# Patient Record
Sex: Female | Born: 1970 | Race: White | Hispanic: No | Marital: Married | Smoking: Never smoker
Health system: Southern US, Community
[De-identification: ages and names within clinical notes are randomized; demographics above are authoritative.]

## PROBLEM LIST (undated history)

## (undated) DIAGNOSIS — G43009 Migraine without aura, not intractable, without status migrainosus: Secondary | ICD-10-CM

## (undated) DIAGNOSIS — F329 Major depressive disorder, single episode, unspecified: Secondary | ICD-10-CM

## (undated) DIAGNOSIS — R87612 Low grade squamous intraepithelial lesion on cytologic smear of cervix (LGSIL): Secondary | ICD-10-CM

## (undated) DIAGNOSIS — F419 Anxiety disorder, unspecified: Secondary | ICD-10-CM

## (undated) DIAGNOSIS — Z803 Family history of malignant neoplasm of breast: Secondary | ICD-10-CM

## (undated) DIAGNOSIS — Z9189 Other specified personal risk factors, not elsewhere classified: Secondary | ICD-10-CM

## (undated) DIAGNOSIS — K219 Gastro-esophageal reflux disease without esophagitis: Secondary | ICD-10-CM

## (undated) DIAGNOSIS — E119 Type 2 diabetes mellitus without complications: Secondary | ICD-10-CM

## (undated) DIAGNOSIS — N920 Excessive and frequent menstruation with regular cycle: Secondary | ICD-10-CM

## (undated) DIAGNOSIS — Z9889 Other specified postprocedural states: Secondary | ICD-10-CM

## (undated) DIAGNOSIS — F32A Depression, unspecified: Secondary | ICD-10-CM

## (undated) DIAGNOSIS — E039 Hypothyroidism, unspecified: Secondary | ICD-10-CM

## (undated) DIAGNOSIS — Z1371 Encounter for nonprocreative screening for genetic disease carrier status: Secondary | ICD-10-CM

## (undated) DIAGNOSIS — Z8041 Family history of malignant neoplasm of ovary: Secondary | ICD-10-CM

## (undated) DIAGNOSIS — R42 Dizziness and giddiness: Secondary | ICD-10-CM

## (undated) HISTORY — PX: CHOLECYSTECTOMY: SHX55

## (undated) HISTORY — DX: Encounter for nonprocreative screening for genetic disease carrier status: Z13.71

## (undated) HISTORY — DX: Major depressive disorder, single episode, unspecified: F32.9

## (undated) HISTORY — DX: Anxiety disorder, unspecified: F41.9

## (undated) HISTORY — DX: Low grade squamous intraepithelial lesion on cytologic smear of cervix (LGSIL): R87.612

## (undated) HISTORY — DX: Excessive and frequent menstruation with regular cycle: N92.0

## (undated) HISTORY — DX: Hypothyroidism, unspecified: E03.9

## (undated) HISTORY — DX: Migraine without aura, not intractable, without status migrainosus: G43.009

## (undated) HISTORY — DX: Depression, unspecified: F32.A

## (undated) HISTORY — DX: Other specified postprocedural states: Z98.890

## (undated) HISTORY — DX: Dizziness and giddiness: R42

## (undated) HISTORY — PX: TUBAL LIGATION: SHX77

## (undated) HISTORY — DX: Other specified personal risk factors, not elsewhere classified: Z91.89

## (undated) HISTORY — DX: Family history of malignant neoplasm of breast: Z80.3

## (undated) HISTORY — DX: Family history of malignant neoplasm of ovary: Z80.41

---

## 1996-02-07 DIAGNOSIS — R87612 Low grade squamous intraepithelial lesion on cytologic smear of cervix (LGSIL): Secondary | ICD-10-CM

## 1996-02-07 HISTORY — DX: Low grade squamous intraepithelial lesion on cytologic smear of cervix (LGSIL): R87.612

## 2000-02-07 HISTORY — PX: VAGINAL HYSTERECTOMY: SUR661

## 2006-05-07 ENCOUNTER — Emergency Department: Payer: Self-pay | Admitting: Emergency Medicine

## 2007-05-10 ENCOUNTER — Emergency Department: Payer: Self-pay | Admitting: Emergency Medicine

## 2008-04-09 ENCOUNTER — Ambulatory Visit: Payer: Self-pay | Admitting: Unknown Physician Specialty

## 2008-05-04 ENCOUNTER — Ambulatory Visit: Payer: Self-pay | Admitting: General Surgery

## 2008-05-11 ENCOUNTER — Ambulatory Visit: Payer: Self-pay | Admitting: General Surgery

## 2009-11-13 ENCOUNTER — Ambulatory Visit: Payer: Self-pay | Admitting: General Practice

## 2010-04-20 ENCOUNTER — Ambulatory Visit: Payer: Self-pay | Admitting: Unknown Physician Specialty

## 2010-04-21 ENCOUNTER — Ambulatory Visit: Payer: Self-pay | Admitting: Unknown Physician Specialty

## 2011-05-09 ENCOUNTER — Ambulatory Visit: Payer: Self-pay | Admitting: General Practice

## 2012-02-07 HISTORY — PX: BREAST BIOPSY: SHX20

## 2012-05-10 ENCOUNTER — Ambulatory Visit: Payer: Self-pay | Admitting: General Practice

## 2012-05-22 ENCOUNTER — Ambulatory Visit: Payer: Self-pay | Admitting: General Practice

## 2012-12-17 DIAGNOSIS — N63 Unspecified lump in unspecified breast: Secondary | ICD-10-CM | POA: Insufficient documentation

## 2013-07-06 ENCOUNTER — Emergency Department: Payer: Self-pay | Admitting: Emergency Medicine

## 2013-07-06 LAB — CBC WITH DIFFERENTIAL/PLATELET
BASOS ABS: 0 10*3/uL (ref 0.0–0.1)
Basophil %: 0.4 %
Eosinophil #: 0.1 10*3/uL (ref 0.0–0.7)
Eosinophil %: 1.2 %
HCT: 41.4 % (ref 35.0–47.0)
HGB: 13.1 g/dL (ref 12.0–16.0)
LYMPHS ABS: 3.5 10*3/uL (ref 1.0–3.6)
LYMPHS PCT: 28.8 %
MCH: 27.1 pg (ref 26.0–34.0)
MCHC: 31.8 g/dL — ABNORMAL LOW (ref 32.0–36.0)
MCV: 85 fL (ref 80–100)
MONO ABS: 0.7 x10 3/mm (ref 0.2–0.9)
Monocyte %: 5.5 %
Neutrophil #: 7.8 10*3/uL — ABNORMAL HIGH (ref 1.4–6.5)
Neutrophil %: 64.1 %
Platelet: 319 10*3/uL (ref 150–440)
RBC: 4.85 10*6/uL (ref 3.80–5.20)
RDW: 13.5 % (ref 11.5–14.5)
WBC: 12.2 10*3/uL — ABNORMAL HIGH (ref 3.6–11.0)

## 2013-07-06 LAB — COMPREHENSIVE METABOLIC PANEL
ALBUMIN: 4.3 g/dL (ref 3.4–5.0)
Alkaline Phosphatase: 70 U/L
Anion Gap: 5 — ABNORMAL LOW (ref 7–16)
BUN: 8 mg/dL (ref 7–18)
Bilirubin,Total: 0.2 mg/dL (ref 0.2–1.0)
CALCIUM: 9.5 mg/dL (ref 8.5–10.1)
Chloride: 106 mmol/L (ref 98–107)
Co2: 26 mmol/L (ref 21–32)
Creatinine: 0.87 mg/dL (ref 0.60–1.30)
EGFR (Non-African Amer.): >60
Glucose: 88 mg/dL (ref 65–99)
OSMOLALITY: 272 (ref 275–301)
Potassium: 3.8 mmol/L (ref 3.5–5.1)
SGOT(AST): 29 U/L (ref 15–37)
SGPT (ALT): 33 U/L (ref 12–78)
Sodium: 137 mmol/L (ref 136–145)
Total Protein: 8.7 g/dL — ABNORMAL HIGH (ref 6.4–8.2)

## 2013-07-06 LAB — URINALYSIS, COMPLETE
BACTERIA: NONE SEEN
BILIRUBIN, UR: NEGATIVE
Glucose,UR: NEGATIVE mg/dL (ref 0–75)
Ketone: NEGATIVE
LEUKOCYTE ESTERASE: NEGATIVE
Nitrite: NEGATIVE
PH: 5 (ref 4.5–8.0)
Protein: NEGATIVE
RBC,UR: 4 /HPF (ref 0–5)
Specific Gravity: 1.011 (ref 1.003–1.030)
Squamous Epithelial: 2
WBC UR: <1 /HPF (ref 0–5)

## 2013-07-06 LAB — LIPASE, BLOOD: LIPASE: 122 U/L (ref 73–393)

## 2013-07-08 ENCOUNTER — Ambulatory Visit: Payer: Self-pay | Admitting: Obstetrics and Gynecology

## 2013-07-08 LAB — CA 125: CA 125: 15.2 U/mL (ref 0.0–34.0)

## 2013-07-08 LAB — WBC: WBC: 9.5 10*3/uL (ref 3.6–11.0)

## 2013-07-08 LAB — HEMATOCRIT: HCT: 37.9 % (ref 35.0–47.0)

## 2013-07-12 LAB — PATHOLOGY REPORT

## 2013-12-10 ENCOUNTER — Ambulatory Visit: Payer: Self-pay | Admitting: General Practice

## 2014-05-30 NOTE — Op Note (Signed)
PATIENT NAME:  Carly Norton, Carly Norton MR#:  [id number] DATE OF BIRTH:  [birth year]  DATE OF PROCEDURE:  07/08/2013  PREOPERATIVE DIAGNOSES:  1. Acute onset pelvic pain.  2. Left adnexal mass.   POSTOPERATIVE DIAGNOSES: Left tubal torsion and simple left ovarian cyst.   OPERATION PERFORMED: Laparoscopic bilateral salpingectomy.   ANESTHESIA USED: General.   PRIMARY SURGEON: Lorrene Reid, MD   ESTIMATED BLOOD LOSS: 25 mL   OPERATIVE FLUIDS: 1 liter of crystalloid.   URINE OUTPUT: 100 mL clear urine.   COMPLICATIONS: None.   FINDINGS: Some adhesions of the sigmoid colon to the left adnexal what initially appeared like consolidated clot in the left adnexa appeared to be tubal remnant that had undergone torsion. The sigmoid colon was mobilized off the left adnexa using a 5 mm Harmonic, and a left salpingectomy was performed. The right fallopian tube remnant appeared normal but had several small paratubal cysts and was removed as well. The remainder of the abdomen appeared grossly normal. The pedicles were hemostatic at the conclusion of the case, both ureters were visualized following the case and were noted to be coursing well away from the field of dissection.   SPECIMENS REMOVED: Right and left tubes. Left tube also had a proximal segment that was sent.   THE PATIENT CONDITION FOLLOWING PROCEDURE: Stable.   PROCEDURE IN DETAIL: Risks, benefits, and alternatives of the procedure were discussed with the patient prior to proceeding to the Operating Room. I also discussed that given the ultrasound finding we could not be conclusive as to whether or not this was arising from the ovary or fallopian tubes, although my review of the images seemed to suggest that it was fallopian tube in the origin. The patient also had a fairly strong family history of breast cancer as well as ovarian cancer in the family, and we did talk about the potential of the finding being malignant given the complexity that  was noted on the imaging, that this would likely require repeat surgery following removal if pathology supported this. The patient was in agreement and wanted to proceed with the case.   She was taken to the Operating Room where she was placed under general endotracheal anesthesia. The patient was positioned in the dorsal lithotomy position using Allen stirrups, prepped and draped in the usual sterile fashion. A timeout procedure was performed. Attention was then turned to the patient's pelvis. A Foley catheter was used to drain the patient's bladder. The patient had previously undergone a hysterectomy and a sponge stick was placed in the vagina to allow visualization of the vaginal cuff. Attention was then turned to the patient's abdomen. The umbilicus was infiltrated with 1.5% lidocaine. A stab incision was made at the base of the umbilicus and the 5 mm XL trocar was used to gain entry into the abdomen under direct visualization. Pneumoperitoneum was then begun. A second 5 mm assistant trocar was placed in the left under direct visualization along with a 10 mm right trocar. Inspection of the abdomen revealed the above findings. The sigmoid colon was mobilized off the left pelvic sidewall to allow manipulation of the left adnexal structure. Upon freeing the sigmoid colon off the sidewall using the 5 mm Harmonic, the left adnexal mass was able to be elevated and it was revealed to be left tubal remnant that had been clearly undergone torsion. The ovary appeared grossly normal other than a small 2 to 3 cm simple cyst. The 5 mm Harmonic was used to mobilize the  tube off of the sidewall and the ovary. Once this was successful, attention was turned to the patient's right adnexa which appeared grossly normal. The fallopian tube was grasped and mobilized off its attachments to the ovary mesosalpinx using the 5 mm Harmonic.   The specimens  were then placed in an Endo Catch bag and removed through the 10 mm port site.  The pelvis was irrigated and all pedicles were noted to be hemostatic. The ureters were visualized bilaterally and noted to be coursing well away from the field of dissection. Pneumoperitoneum was then evacuated. The trocar sites were removed. The 10 mm trocar site was closed using a 4-0 Monocryl. The remainder of the trocar sites were closed with Dermabond. Sponge, needle instrument counts were correct x 2. The patient tolerated the procedure well and was taken to the recovery room in stable condition.    ____________________________ Florina OuAndreas M. Bonney AidStaebler, MD ams:sg D: 07/09/2013 08:54:23 ET T: 07/09/2013 09:13:27 ET JOB#: [id number]  cc: Florina OuAndreas M. Bonney AidStaebler, MD, <Dictator> Carmel SacramentoANDREAS Cathrine MusterM Kameo Bains MD ELECTRONICALLY SIGNED 07/17/2013 8:30

## 2015-02-03 ENCOUNTER — Other Ambulatory Visit: Payer: Self-pay | Admitting: Physician Assistant

## 2015-02-03 DIAGNOSIS — G43919 Migraine, unspecified, intractable, without status migrainosus: Secondary | ICD-10-CM

## 2015-03-12 ENCOUNTER — Other Ambulatory Visit: Payer: Self-pay | Admitting: Obstetrics and Gynecology

## 2015-03-12 DIAGNOSIS — N63 Unspecified lump in unspecified breast: Secondary | ICD-10-CM

## 2015-03-24 ENCOUNTER — Ambulatory Visit
Admission: RE | Admit: 2015-03-24 | Discharge: 2015-03-24 | Disposition: A | Discharge status: Home / Self Care | Payer: BLUE CROSS/BLUE SHIELD | Source: Ambulatory Visit | Attending: Obstetrics and Gynecology | Admitting: Obstetrics and Gynecology

## 2015-03-24 ENCOUNTER — Other Ambulatory Visit: Payer: Self-pay | Admitting: Obstetrics and Gynecology

## 2015-03-24 DIAGNOSIS — N63 Unspecified lump in unspecified breast: Secondary | ICD-10-CM

## 2015-03-25 ENCOUNTER — Other Ambulatory Visit: Payer: Self-pay | Admitting: Obstetrics and Gynecology

## 2015-03-25 DIAGNOSIS — N63 Unspecified lump in unspecified breast: Secondary | ICD-10-CM

## 2015-04-01 ENCOUNTER — Ambulatory Visit
Admission: RE | Admit: 2015-04-01 | Discharge: 2015-04-01 | Disposition: A | Discharge status: Home / Self Care | Payer: BLUE CROSS/BLUE SHIELD | Source: Ambulatory Visit | Attending: Obstetrics and Gynecology | Admitting: Obstetrics and Gynecology

## 2015-04-01 DIAGNOSIS — N63 Unspecified lump in unspecified breast: Secondary | ICD-10-CM

## 2015-04-01 DIAGNOSIS — Q859 Phakomatosis, unspecified: Secondary | ICD-10-CM | POA: Diagnosis not present

## 2015-04-01 HISTORY — PX: BREAST BIOPSY: SHX20

## 2015-04-02 LAB — SURGICAL PATHOLOGY

## 2015-06-23 ENCOUNTER — Other Ambulatory Visit: Payer: Self-pay | Admitting: Physician Assistant

## 2015-10-08 ENCOUNTER — Other Ambulatory Visit: Payer: Self-pay | Admitting: Physician Assistant

## 2015-10-08 DIAGNOSIS — G43919 Migraine, unspecified, intractable, without status migrainosus: Secondary | ICD-10-CM

## 2016-04-26 ENCOUNTER — Other Ambulatory Visit: Payer: Self-pay | Admitting: Physician Assistant

## 2016-04-26 DIAGNOSIS — G43919 Migraine, unspecified, intractable, without status migrainosus: Secondary | ICD-10-CM

## 2016-06-12 DIAGNOSIS — H524 Presbyopia: Secondary | ICD-10-CM | POA: Diagnosis not present

## 2016-09-12 ENCOUNTER — Other Ambulatory Visit: Payer: Self-pay | Admitting: Family Medicine

## 2016-09-12 DIAGNOSIS — R928 Other abnormal and inconclusive findings on diagnostic imaging of breast: Secondary | ICD-10-CM

## 2016-10-30 ENCOUNTER — Ambulatory Visit
Admission: RE | Admit: 2016-10-30 | Discharge: 2016-10-30 | Disposition: A | Discharge status: Home / Self Care | Payer: BLUE CROSS/BLUE SHIELD | Source: Ambulatory Visit | Attending: Family Medicine | Admitting: Family Medicine

## 2016-10-30 DIAGNOSIS — R928 Other abnormal and inconclusive findings on diagnostic imaging of breast: Secondary | ICD-10-CM | POA: Insufficient documentation

## 2016-10-30 DIAGNOSIS — N6489 Other specified disorders of breast: Secondary | ICD-10-CM | POA: Diagnosis not present

## 2016-10-30 DIAGNOSIS — N6001 Solitary cyst of right breast: Secondary | ICD-10-CM | POA: Diagnosis not present

## 2017-09-26 ENCOUNTER — Other Ambulatory Visit: Payer: Self-pay | Admitting: Family Medicine

## 2017-09-26 DIAGNOSIS — Z1231 Encounter for screening mammogram for malignant neoplasm of breast: Secondary | ICD-10-CM

## 2017-11-02 ENCOUNTER — Ambulatory Visit
Admission: RE | Admit: 2017-11-02 | Discharge: 2017-11-02 | Disposition: A | Discharge status: Home / Self Care | Payer: BLUE CROSS/BLUE SHIELD | Source: Ambulatory Visit | Attending: Family Medicine | Admitting: Family Medicine

## 2017-11-02 DIAGNOSIS — Z1231 Encounter for screening mammogram for malignant neoplasm of breast: Secondary | ICD-10-CM | POA: Diagnosis not present

## 2017-12-27 ENCOUNTER — Ambulatory Visit: Payer: Self-pay | Admitting: Nurse Practitioner

## 2018-05-08 DIAGNOSIS — Z1371 Encounter for nonprocreative screening for genetic disease carrier status: Secondary | ICD-10-CM

## 2018-05-08 DIAGNOSIS — Z9189 Other specified personal risk factors, not elsewhere classified: Secondary | ICD-10-CM

## 2018-05-08 HISTORY — DX: Other specified personal risk factors, not elsewhere classified: Z91.89

## 2018-05-08 HISTORY — DX: Encounter for nonprocreative screening for genetic disease carrier status: Z13.71

## 2018-05-15 ENCOUNTER — Encounter: Payer: Self-pay | Admitting: Obstetrics and Gynecology

## 2018-05-15 ENCOUNTER — Other Ambulatory Visit: Payer: Self-pay

## 2018-05-15 ENCOUNTER — Ambulatory Visit (INDEPENDENT_AMBULATORY_CARE_PROVIDER_SITE_OTHER): Payer: 59 | Admitting: Obstetrics and Gynecology

## 2018-05-15 VITALS — BP 140/80 | Ht 65.0 in | Wt 232.0 lb

## 2018-05-15 DIAGNOSIS — Z803 Family history of malignant neoplasm of breast: Secondary | ICD-10-CM

## 2018-05-15 DIAGNOSIS — Z801 Family history of malignant neoplasm of trachea, bronchus and lung: Secondary | ICD-10-CM | POA: Diagnosis not present

## 2018-05-15 DIAGNOSIS — Z853 Personal history of malignant neoplasm of breast: Secondary | ICD-10-CM | POA: Diagnosis not present

## 2018-05-15 DIAGNOSIS — N6310 Unspecified lump in the right breast, unspecified quadrant: Secondary | ICD-10-CM

## 2018-05-15 DIAGNOSIS — C50912 Malignant neoplasm of unspecified site of left female breast: Secondary | ICD-10-CM | POA: Diagnosis not present

## 2018-05-15 DIAGNOSIS — Z8041 Family history of malignant neoplasm of ovary: Secondary | ICD-10-CM

## 2018-05-15 DIAGNOSIS — C50911 Malignant neoplasm of unspecified site of right female breast: Secondary | ICD-10-CM | POA: Diagnosis not present

## 2018-05-15 DIAGNOSIS — N6311 Unspecified lump in the right breast, upper outer quadrant: Secondary | ICD-10-CM

## 2018-05-15 DIAGNOSIS — N6320 Unspecified lump in the left breast, unspecified quadrant: Secondary | ICD-10-CM

## 2018-05-15 DIAGNOSIS — N6325 Unspecified lump in the left breast, overlapping quadrants: Secondary | ICD-10-CM

## 2018-05-15 NOTE — Patient Instructions (Signed)
I value your feedback and entrusting us with your care. If you get a [facility] patient survey, I would appreciate you taking the time to let us know about your experience today. Thank you! 

## 2018-05-15 NOTE — Progress Notes (Addendum)
Patient, No Pcp Per   Chief Complaint  Patient presents with  . BREAST EXAM    lump on left breast, shooting pain for the last couple of weeks, lump on right breast, not sure if it has gotten bigger since last felt    HPI:      [name] is a 48 y.o. No obstetric history on file. who LMP was No LMP recorded. Patient has had a hysterectomy., presents today for bilat breast masses. Pt noticed a larger LT breast mass outer quadrant for several wks. Area is not tender to touch but does shoot pains to her breast. No recent trauma, erythema, nipple d/c. 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 Pt also with new RT breast mass near axilla. Area is more prominent and has outer quadrant breast tenderness. Again, no erythema, trauma, nipple d/c. Hx of complicated cysts RT breast with resolution in 5-6:00 position in 2018.  Pt with FH breast and ovar cancer, genetic testing never done.  Pt is s/p vag hyst with Dr. Rayford Halsted in 2002 for menorrhagia/anemia. No longer doing paps. No recent annual.  Past Medical History:  Diagnosis Date  . Anxiety and depression   . Common migraine   . Family history of breast cancer   . Family history of ovarian cancer   . Hypothyroidism   . LGSIL on Pap smear of cervix 1998  . Menorrhagia   . Vertigo     Past Surgical History:  Procedure Laterality Date  . BREAST BIOPSY Left 2014   benign  . BREAST BIOPSY Left 04/01/2015   path pending  . TUBAL LIGATION    . VAGINAL HYSTERECTOMY  2002   Dr. Rayford Halsted; due to menorrhagia/anemia    Family History  Problem Relation Age of Onset  . Breast cancer Paternal Aunt 43  . Hypothyroidism Mother   . Cancer Father   . Heart failure Father   . Depression Sister   . Ovarian cancer Maternal Grandmother 37  . Pancreatic cancer Maternal Grandfather 75  . Breast cancer Paternal Aunt 62  . Colon cancer Maternal Aunt 39    Social History   Socioeconomic  History  . Marital status: Married    Spouse name: Not on file  . Number of children: Not on file  . Years of education: Not on file  . Highest education level: Not on file  Occupational History  . Not on file  Social Needs  . Financial resource strain: Not on file  . Food insecurity:    Worry: Not on file    Inability: Not on file  . Transportation needs:    Medical: Not on file    Non-medical: Not on file  Tobacco Use  . Smoking status: Never Smoker  . Smokeless tobacco: Never Used  Substance and Sexual Activity  . Alcohol use: Not Currently    Frequency: Never  . Drug use: Not Currently  . Sexual activity: Yes    Birth control/protection: Surgical    Comment: Hysterectomy  Lifestyle  . Physical activity:    Days per week: Not on file    Minutes per session: Not on file  . Stress: Not on file  Relationships  . Social connections:    Talks on phone: Not on file    Gets together: Not on file    Attends religious service: Not on file    Active member of club or organization: Not  on file    Attends meetings of clubs or organizations: Not on file    Relationship status: Not on file  . Intimate partner violence:    Fear of current or ex partner: Not on file    Emotionally abused: Not on file    Physically abused: Not on file    Forced sexual activity: Not on file  Other Topics Concern  . Not on file  Social History Narrative  . Not on file    Outpatient Medications Prior to Visit  Medication Sig Dispense Refill  . busPIRone (BUSPAR) 10 MG tablet     . eletriptan (RELPAX) 20 MG tablet Take by mouth.    . EPINEPHrine 0.3 mg/0.3 mL IJ SOAJ injection Inject into the muscle.    Marland Kitchen FLUoxetine (PROZAC) 40 MG capsule     . levothyroxine (SYNTHROID, LEVOTHROID) 75 MCG tablet Take by mouth.    . meclizine (ANTIVERT) 25 MG tablet Take by mouth.    . topiramate (TOPAMAX) 25 MG tablet TAKE UP TO 4 TABLETS A DAY 120 tablet 3   No facility-administered medications prior to  visit.       ROS:  Review of Systems  Constitutional: Positive for fatigue. Negative for fever and unexpected weight change.  Respiratory: Negative for cough, shortness of breath and wheezing.   Cardiovascular: Negative for chest pain, palpitations and leg swelling.  Gastrointestinal: Negative for blood in stool, constipation, diarrhea, nausea and vomiting.  Endocrine: Negative for cold intolerance, heat intolerance and polyuria.  Genitourinary: Positive for frequency. Negative for dyspareunia, dysuria, flank pain, genital sores, hematuria, menstrual problem, pelvic pain, urgency, vaginal bleeding, vaginal discharge and vaginal pain.  Musculoskeletal: Negative for back pain, joint swelling and myalgias.  Skin: Negative for rash.  Neurological: Positive for dizziness and headaches. Negative for syncope, light-headedness and numbness.  Hematological: Negative for adenopathy.  Psychiatric/Behavioral: Positive for agitation and dysphoric mood. Negative for confusion, sleep disturbance and suicidal ideas. The patient is not nervous/anxious.    BREAST: lumps/tenderness   OBJECTIVE:   Vitals:  BP 140/80   Ht _0  (1.651 m)   Wt 232 lb (105.2 kg)   BMI 38.61 kg/m   Physical Exam Vitals signs reviewed.  Neck:     Musculoskeletal: Normal range of motion.  Pulmonary:     Effort: Pulmonary effort is normal.  Chest:     Breasts: Breasts are symmetrical.        Right: No inverted nipple, mass, nipple discharge, skin change or tenderness.        Left: No inverted nipple, mass, nipple discharge, skin change or tenderness.    Musculoskeletal: Normal range of motion.  Skin:    General: Skin is warm and dry.  Neurological:     General: No focal deficit present.     Mental Status: She is alert and oriented to person, place, and time.     Cranial Nerves: No cranial nerve deficit.  Psychiatric:        Mood and Affect: Mood normal.        Behavior: Behavior normal.        Thought  Content: Thought content normal.        Judgment: Judgment normal.     Assessment/Plan: Breast mass, left - LOQ; check dx mammo and u/s. Hx of hemartoma in same area, but area is larger now. Will f/u with results.  - Plan: US BREAST LTD UNI LEFT INC AXILLA, MM DIAG BREAST TOMO BILATERAL, US BREAST LTD UNI RIGHT  INC AXILLA  Breast mass, right - RT axilla. Check dx mammo and u/s. If neg, most likely prominent tissue. Will f/u with results.  - Plan: US BREAST LTD UNI LEFT INC AXILLA, MM DIAG BREAST TOMO BILATERAL, US BREAST LTD UNI RIGHT INC AXILLA  Family history of breast cancer - MyRisk testing discussed and done today. Will call wiht resutls.  - Plan: [facility])  Family history of ovarian cancer - Plan: [facility] ([place])    Return in about 2 months (around 08/10/9161) for annual.  Dock Baccam B. Taneesha Edgin, PA-C 05/15/2018 11:06 AM

## 2018-05-20 ENCOUNTER — Ambulatory Visit
Admission: RE | Admit: 2018-05-20 | Discharge: 2018-05-20 | Disposition: A | Discharge status: Home / Self Care | Payer: BLUE CROSS/BLUE SHIELD | Source: Ambulatory Visit | Attending: Obstetrics and Gynecology | Admitting: Obstetrics and Gynecology

## 2018-05-20 ENCOUNTER — Other Ambulatory Visit: Payer: Self-pay

## 2018-05-20 DIAGNOSIS — N6310 Unspecified lump in the right breast, unspecified quadrant: Secondary | ICD-10-CM

## 2018-05-20 DIAGNOSIS — N6320 Unspecified lump in the left breast, unspecified quadrant: Secondary | ICD-10-CM | POA: Diagnosis not present

## 2018-05-30 ENCOUNTER — Encounter: Payer: Self-pay | Admitting: Obstetrics and Gynecology

## 2018-06-14 ENCOUNTER — Encounter: Payer: Self-pay | Admitting: Obstetrics and Gynecology

## 2018-06-25 ENCOUNTER — Telehealth: Payer: Self-pay | Admitting: Obstetrics and Gynecology

## 2018-06-25 NOTE — Telephone Encounter (Signed)
Pt aware of neg MyRisk results except ATM and MSH2 VUS. IBIS=22.5%/riskscore=16.2%. Pt's last mammo 4/20. Discussed monthly SBE, yearly CBE (pt annual due 6/20), yearly mammo and scr breast MRI. Pt to call for breast MRI by 9/20 if interested.   Patient understands these results only apply to her and her children, and this is not indicative of genetic testing results of her other family members. It is recommended that her other family members have genetic testing done.  Pt also understands negative genetic testing doesn't mean she will never get any of these cancers.   Hard copy mailed to pt. F/u prn.

## 2018-07-05 ENCOUNTER — Other Ambulatory Visit: Payer: Self-pay

## 2018-07-10 LAB — CMP12+LP+TP+TSH+6AC+CBC/D/PLT

## 2018-07-10 LAB — FSH/LH

## 2018-07-15 ENCOUNTER — Other Ambulatory Visit: Payer: Self-pay

## 2018-07-16 LAB — CMP12+LP+TP+TSH+6AC+CBC/D/PLT
ALT: 22 IU/L (ref 0–32)
AST: 19 IU/L (ref 0–40)
Albumin/Globulin Ratio: 1.4 (ref 1.2–2.2)
Albumin: 4 g/dL (ref 3.8–4.8)
Alkaline Phosphatase: 69 IU/L (ref 39–117)
BUN/Creatinine Ratio: 13 (ref 9–23)
BUN: 10 mg/dL (ref 6–24)
Basophils Absolute: 0.1 10*3/uL (ref 0.0–0.2)
Basos: 1 %
Bilirubin Total: <0.2 mg/dL (ref 0.0–1.2)
Calcium: 8.8 mg/dL (ref 8.7–10.2)
Chloride: 103 mmol/L (ref 96–106)
Chol/HDL Ratio: 4.4 ratio (ref 0.0–4.4)
Cholesterol, Total: 188 mg/dL (ref 100–199)
Creatinine, Ser: 0.76 mg/dL (ref 0.57–1.00)
EOS (ABSOLUTE): 0.2 10*3/uL (ref 0.0–0.4)
Eos: 2 %
Estimated CHD Risk: 1 times avg. (ref 0.0–1.0)
Free Thyroxine Index: 0.8 — ABNORMAL LOW (ref 1.2–4.9)
GFR calc Af Amer: 107 mL/min/{1.73_m2} (ref >59)
GFR calc non Af Amer: 93 mL/min/{1.73_m2} (ref >59)
GGT: 19 IU/L (ref 0–60)
Globulin, Total: 2.9 g/dL (ref 1.5–4.5)
Glucose: 89 mg/dL (ref 65–99)
HDL: 43 mg/dL (ref >39)
Hematocrit: 35.6 % (ref 34.0–46.6)
Hemoglobin: 11.7 g/dL (ref 11.1–15.9)
Immature Grans (Abs): 0 10*3/uL (ref 0.0–0.1)
Immature Granulocytes: 0 %
Iron: 42 ug/dL (ref 27–159)
LDH: 142 IU/L (ref 119–226)
LDL Calculated: 97 mg/dL (ref 0–99)
Lymphocytes Absolute: 2.5 10*3/uL (ref 0.7–3.1)
Lymphs: 30 %
MCH: 26.5 pg — ABNORMAL LOW (ref 26.6–33.0)
MCHC: 32.9 g/dL (ref 31.5–35.7)
MCV: 81 fL (ref 79–97)
Monocytes Absolute: 0.6 10*3/uL (ref 0.1–0.9)
Monocytes: 7 %
Neutrophils Absolute: 5 10*3/uL (ref 1.4–7.0)
Neutrophils: 60 %
Phosphorus: 4 mg/dL (ref 3.0–4.3)
Platelets: 304 10*3/uL (ref 150–450)
Potassium: 4 mmol/L (ref 3.5–5.2)
RBC: 4.42 x10E6/uL (ref 3.77–5.28)
RDW: 13.2 % (ref 11.7–15.4)
Sodium: 137 mmol/L (ref 134–144)
T3 Uptake Ratio: 13 % — ABNORMAL LOW (ref 24–39)
T4, Total: 5.9 ug/dL (ref 4.5–12.0)
TSH: 18.9 u[IU]/mL — ABNORMAL HIGH (ref 0.450–4.500)
Total Protein: 6.9 g/dL (ref 6.0–8.5)
Triglycerides: 238 mg/dL — ABNORMAL HIGH (ref 0–149)
Uric Acid: 6.9 mg/dL (ref 2.5–7.1)
VLDL Cholesterol Cal: 48 mg/dL — ABNORMAL HIGH (ref 5–40)
WBC: 8.3 10*3/uL (ref 3.4–10.8)

## 2018-07-16 LAB — FSH/LH
FSH: 1.5 m[IU]/mL
LH: 4.9 m[IU]/mL

## 2018-07-22 DIAGNOSIS — H6980 Other specified disorders of Eustachian tube, unspecified ear: Secondary | ICD-10-CM | POA: Diagnosis not present

## 2018-07-22 DIAGNOSIS — H8110 Benign paroxysmal vertigo, unspecified ear: Secondary | ICD-10-CM | POA: Diagnosis not present

## 2018-07-22 DIAGNOSIS — H93299 Other abnormal auditory perceptions, unspecified ear: Secondary | ICD-10-CM | POA: Diagnosis not present

## 2018-07-22 DIAGNOSIS — H8112 Benign paroxysmal vertigo, left ear: Secondary | ICD-10-CM | POA: Diagnosis not present

## 2018-07-22 DIAGNOSIS — H903 Sensorineural hearing loss, bilateral: Secondary | ICD-10-CM | POA: Diagnosis not present

## 2018-08-13 ENCOUNTER — Other Ambulatory Visit: Payer: Self-pay | Admitting: Registered Nurse

## 2018-08-14 NOTE — Telephone Encounter (Signed)
Forwarding from [facility]

## 2018-08-14 NOTE — Telephone Encounter (Signed)
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

## 2018-09-16 ENCOUNTER — Other Ambulatory Visit: Payer: Self-pay

## 2018-11-05 ENCOUNTER — Other Ambulatory Visit: Payer: Self-pay

## 2018-11-05 DIAGNOSIS — K219 Gastro-esophageal reflux disease without esophagitis: Secondary | ICD-10-CM

## 2018-11-05 MED ORDER — PANTOPRAZOLE SODIUM 40 MG PO TBEC: 40.0000 mg | DELAYED_RELEASE_TABLET | Freq: Every day | ORAL | 0 refills | Status: AC

## 2018-12-04 ENCOUNTER — Other Ambulatory Visit: Payer: Self-pay

## 2018-12-04 DIAGNOSIS — T7840XD Allergy, unspecified, subsequent encounter: Secondary | ICD-10-CM

## 2018-12-04 DIAGNOSIS — H8110 Benign paroxysmal vertigo, unspecified ear: Secondary | ICD-10-CM

## 2018-12-04 MED ORDER — MECLIZINE HCL 25 MG PO TABS: 25.0000 mg | ORAL_TABLET | Freq: Two times a day (BID) | ORAL | 0 refills | Status: DC | PRN

## 2018-12-04 MED ORDER — EPINEPHRINE 0.3 MG/0.3ML IJ SOAJ: 0.3000 mg | INTRAMUSCULAR | 0 refills | Status: AC | PRN

## 2018-12-04 NOTE — Telephone Encounter (Signed)
Last appt with Dr Roxan Hockey 07/17/2018 and instructed to schedule follow up with ENT as meclizine not working as well for her.  No visits noted in Epic;   paper chart at [facility] noted visit July 22 2018 BPPV treated with Epley maneuvers.  And she was to follow up prn.  Refilled meclizine and epi pen electronically to her pharmacy of choice today.  Meclizine 25mg  po BID prn #30 RF0 and epipen im prn use 0.3mg  #1 RF0  Patient to follow up with ENT if no improvement in her symptoms with meclizine.  Ensure she is taking her seasonal allergy medications and showering before bed and not sleeping with windows open also.  Avoid driving during vertigo and caution meclizine can cause drowsiness.

## 2019-01-15 HISTORY — PX: BREAST BIOPSY: SHX20

## 2019-01-26 ENCOUNTER — Other Ambulatory Visit: Payer: Self-pay | Admitting: Registered Nurse

## 2019-01-26 ENCOUNTER — Encounter: Payer: Self-pay | Admitting: Registered Nurse

## 2019-01-26 DIAGNOSIS — G43909 Migraine, unspecified, not intractable, without status migrainosus: Secondary | ICD-10-CM | POA: Insufficient documentation

## 2019-01-26 DIAGNOSIS — R42 Dizziness and giddiness: Secondary | ICD-10-CM | POA: Insufficient documentation

## 2019-01-26 DIAGNOSIS — E039 Hypothyroidism, unspecified: Secondary | ICD-10-CM | POA: Insufficient documentation

## 2019-01-26 DIAGNOSIS — T7840XD Allergy, unspecified, subsequent encounter: Secondary | ICD-10-CM

## 2019-01-26 DIAGNOSIS — H8110 Benign paroxysmal vertigo, unspecified ear: Secondary | ICD-10-CM

## 2019-01-26 NOTE — Telephone Encounter (Signed)
[facility] refilled patient on 12/04/2018.  Please contact patient to verify if meclizine working and how many epi pens she has used since 12/04/2018 and what the circumstances were.  No ENT visits seen in Epic or clinic visits since last visit with Dr Roxan Hockey (see 12/04/2018 tcon).

## 2019-01-27 ENCOUNTER — Telehealth: Payer: Self-pay

## 2019-01-27 NOTE — Telephone Encounter (Signed)
Tried to call Marishka for information that Otila Kluver is requesting.  Went straight to voice mail & mailbox full & unable to leave a message.  AMD

## 2019-01-27 NOTE — Telephone Encounter (Signed)
Didn't hear from [place].  Sent message to her about questions that need to be answered.  AMD

## 2019-01-29 ENCOUNTER — Other Ambulatory Visit: Payer: Self-pay

## 2019-01-29 DIAGNOSIS — Z8669 Personal history of other diseases of the nervous system and sense organs: Secondary | ICD-10-CM

## 2019-01-29 MED ORDER — ELETRIPTAN HYDROBROMIDE 40 MG PO TABS: ORAL_TABLET | ORAL | 2 refills | Status: AC

## 2019-01-29 NOTE — Telephone Encounter (Signed)
Finally made contact with Carly Norton.  States she didn't request Epi Pen refill.  Said she has one.  Doesn't know if she hit by mistake.  Meclizine - states she has to take every day or she doesn't feel right.  Appt scheduled for next week - fatigue. 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    AMD

## 2019-01-30 ENCOUNTER — Encounter: Payer: Self-pay | Admitting: Registered Nurse

## 2019-01-30 NOTE — Telephone Encounter (Signed)
Noted appt pending.  Meclizine refilled. No epi pen refill needed at this time.  Electronic Rx sent to her pharmacy of choice.

## 2019-02-02 ENCOUNTER — Emergency Department: Payer: 59

## 2019-02-02 ENCOUNTER — Other Ambulatory Visit: Payer: Self-pay

## 2019-02-02 ENCOUNTER — Inpatient Hospital Stay
Admission: EM | Admit: 2019-02-02 | Discharge: 2019-02-05 | DRG: 287 | Disposition: A | Discharge status: Home / Self Care | Payer: 59 | Attending: Internal Medicine | Admitting: Internal Medicine

## 2019-02-02 DIAGNOSIS — E039 Hypothyroidism, unspecified: Secondary | ICD-10-CM | POA: Diagnosis present

## 2019-02-02 DIAGNOSIS — E876 Hypokalemia: Secondary | ICD-10-CM | POA: Diagnosis present

## 2019-02-02 DIAGNOSIS — E785 Hyperlipidemia, unspecified: Secondary | ICD-10-CM | POA: Diagnosis present

## 2019-02-02 DIAGNOSIS — Z818 Family history of other mental and behavioral disorders: Secondary | ICD-10-CM

## 2019-02-02 DIAGNOSIS — I2489 Other forms of acute ischemic heart disease: Secondary | ICD-10-CM

## 2019-02-02 DIAGNOSIS — I10 Essential (primary) hypertension: Secondary | ICD-10-CM | POA: Diagnosis not present

## 2019-02-02 DIAGNOSIS — Z90710 Acquired absence of both cervix and uterus: Secondary | ICD-10-CM

## 2019-02-02 DIAGNOSIS — I160 Hypertensive urgency: Secondary | ICD-10-CM

## 2019-02-02 DIAGNOSIS — Z91040 Latex allergy status: Secondary | ICD-10-CM | POA: Diagnosis not present

## 2019-02-02 DIAGNOSIS — Z803 Family history of malignant neoplasm of breast: Secondary | ICD-10-CM

## 2019-02-02 DIAGNOSIS — R739 Hyperglycemia, unspecified: Secondary | ICD-10-CM | POA: Diagnosis present

## 2019-02-02 DIAGNOSIS — Z6836 Body mass index (BMI) 36.0-36.9, adult: Secondary | ICD-10-CM | POA: Diagnosis not present

## 2019-02-02 DIAGNOSIS — Z20828 Contact with and (suspected) exposure to other viral communicable diseases: Secondary | ICD-10-CM | POA: Diagnosis not present

## 2019-02-02 DIAGNOSIS — Z8249 Family history of ischemic heart disease and other diseases of the circulatory system: Secondary | ICD-10-CM | POA: Diagnosis not present

## 2019-02-02 DIAGNOSIS — F329 Major depressive disorder, single episode, unspecified: Secondary | ICD-10-CM | POA: Diagnosis present

## 2019-02-02 DIAGNOSIS — Z91030 Bee allergy status: Secondary | ICD-10-CM | POA: Diagnosis not present

## 2019-02-02 DIAGNOSIS — Z8041 Family history of malignant neoplasm of ovary: Secondary | ICD-10-CM

## 2019-02-02 DIAGNOSIS — R42 Dizziness and giddiness: Secondary | ICD-10-CM | POA: Diagnosis present

## 2019-02-02 DIAGNOSIS — Z79890 Hormone replacement therapy (postmenopausal): Secondary | ICD-10-CM

## 2019-02-02 DIAGNOSIS — R0602 Shortness of breath: Secondary | ICD-10-CM | POA: Diagnosis not present

## 2019-02-02 DIAGNOSIS — Z79899 Other long term (current) drug therapy: Secondary | ICD-10-CM | POA: Diagnosis not present

## 2019-02-02 DIAGNOSIS — I214 Non-ST elevation (NSTEMI) myocardial infarction: Secondary | ICD-10-CM

## 2019-02-02 DIAGNOSIS — I259 Chronic ischemic heart disease, unspecified: Secondary | ICD-10-CM

## 2019-02-02 DIAGNOSIS — I248 Other forms of acute ischemic heart disease: Principal | ICD-10-CM | POA: Diagnosis present

## 2019-02-02 DIAGNOSIS — R69 Illness, unspecified: Secondary | ICD-10-CM | POA: Diagnosis not present

## 2019-02-02 DIAGNOSIS — F419 Anxiety disorder, unspecified: Secondary | ICD-10-CM

## 2019-02-02 DIAGNOSIS — Z888 Allergy status to other drugs, medicaments and biological substances status: Secondary | ICD-10-CM

## 2019-02-02 DIAGNOSIS — E669 Obesity, unspecified: Secondary | ICD-10-CM | POA: Diagnosis not present

## 2019-02-02 LAB — CBC
HCT: 35.8 % — ABNORMAL LOW (ref 36.0–46.0)
Hemoglobin: 11.5 g/dL — ABNORMAL LOW (ref 12.0–15.0)
MCH: 26.7 pg (ref 26.0–34.0)
MCHC: 32.1 g/dL (ref 30.0–36.0)
MCV: 83.1 fL (ref 80.0–100.0)
Platelets: 290 10*3/uL (ref 150–400)
RBC: 4.31 MIL/uL (ref 3.87–5.11)
RDW: 13.9 % (ref 11.5–15.5)
WBC: 9.7 10*3/uL (ref 4.0–10.5)
nRBC: 0 % (ref 0.0–0.2)

## 2019-02-02 LAB — BASIC METABOLIC PANEL
Anion gap: 9 (ref 5–15)
BUN: 11 mg/dL (ref 6–20)
CO2: 25 mmol/L (ref 22–32)
Calcium: 8.9 mg/dL (ref 8.9–10.3)
Chloride: 106 mmol/L (ref 98–111)
Creatinine, Ser: 0.82 mg/dL (ref 0.44–1.00)
GFR calc Af Amer: >60 mL/min (ref >60)
GFR calc non Af Amer: >60 mL/min (ref >60)
Glucose, Bld: 128 mg/dL — ABNORMAL HIGH (ref 70–99)
Potassium: 3.6 mmol/L (ref 3.5–5.1)
Sodium: 140 mmol/L (ref 135–145)

## 2019-02-02 LAB — TROPONIN I (HIGH SENSITIVITY): Troponin I (High Sensitivity): 185 ng/L (ref <18)

## 2019-02-02 MED ORDER — HEPARIN SODIUM (PORCINE) 5000 UNIT/ML IJ SOLN
4000.0000 [IU] | Freq: Once | INTRAMUSCULAR | Status: DC
  Administered 2019-02-03: 4000 [IU] via INTRAVENOUS
  Filled 2019-02-02: qty 1

## 2019-02-02 MED ORDER — NITROGLYCERIN 2 % TD OINT
1.0000 [in_us] | TOPICAL_OINTMENT | Freq: Once | TRANSDERMAL | Status: AC
  Administered 2019-02-03: 1 [in_us] via TOPICAL
  Filled 2019-02-02: qty 1

## 2019-02-02 MED ORDER — HEPARIN (PORCINE) 25000 UT/250ML-% IV SOLN
1400.0000 [IU]/h | INTRAVENOUS | Status: DC
  Filled 2019-02-02: qty 250

## 2019-02-02 MED ORDER — ASPIRIN 81 MG PO CHEW
324.0000 mg | CHEWABLE_TABLET | Freq: Once | ORAL | Status: AC
  Administered 2019-02-03: 324 mg via ORAL
  Filled 2019-02-02: qty 4

## 2019-02-02 NOTE — ED Notes (Signed)
Carney Harder, rn notified of pt's troponin level, heather charge rn is rooming pt at this time.

## 2019-02-02 NOTE — ED Triage Notes (Signed)
Pt states has felt shob for a day. Pt states she has had chest discomfort off and on for days. Pt states today at work a Art therapist noted her face was flushed and checked her blood pressure. Pt states reading was 208/132. Pt does appears shob with exertion.

## 2019-02-02 NOTE — ED Notes (Signed)
Dr. Beather Arbour notified of elevated critical troponin of 185, no new orders received.

## 2019-02-02 NOTE — ED Provider Notes (Signed)
[facility] Emergency Department Provider Note   ____________________________________________   First MD Initiated Contact with Patient 02/02/19 2333     (approximate)  I have reviewed the triage vital signs and the nursing notes.   HISTORY  Chief Complaint Hypertension    HPI Carly Norton is a 48 y.o. female who presents to the ED from home with a chief complaint of chest pain and shortness of breath.  Patient with a history of migraines, hypothyroidism who experienced chest discomfort earlier this evening.  Coworkers noted her face was flushed and checked her blood pressure which was 208/132.  Patient does not take antihypertensives.  Complains of left-sided chest pressure, nonradiating, not associated diaphoresis, nausea/vomiting, palpitations or dizziness.  Does complain of dyspnea on exertion.  Denies recent fever, cough, abdominal pain, diarrhea.  Denies recent travel or trauma.  Denies OCP use.      Past Medical History:  Diagnosis Date  . Anxiety and depression   . BRCA negative 05/2018   MyRisk neg except ATM and MSH2 VUS  . Common migraine   . Family history of breast cancer   . Family history of ovarian cancer   . H/O benign breast biopsy   . Hypothyroidism   . Increased risk of breast cancer 05/2018   IBIS=22.5%/riskscore=16.2%  . LGSIL on Pap smear of cervix 1998  . Menorrhagia   . Vertigo     Patient Active Problem List   Diagnosis Date Noted  . NSTEMI (non-ST elevated myocardial infarction) ([place]) 02/03/2019  . Hypertensive urgency 02/03/2019  . Anxiety 02/03/2019  . Hypothyroidism 01/26/2019  . Migraine 01/26/2019  . Vertigo 01/26/2019  . Breast mass 12/17/2012    Past Surgical History:  Procedure Laterality Date  . BREAST BIOPSY Left 2014   benign  . BREAST BIOPSY Left 04/01/2015   path pending  . TUBAL LIGATION    . VAGINAL HYSTERECTOMY  2002   Dr. Rayford Halsted; due to menorrhagia/anemia    Prior to Admission  medications   Medication Sig Start Date End Date Taking? Authorizing Provider  busPIRone (BUSPAR) 10 MG tablet  02/13/18   [provider]  eletriptan (RELPAX) 40 MG tablet Take 1 tablet po as a single dose.  May repeat dose once in two hours 01/29/19   Sable Feil, PA-C  EPINEPHrine 0.3 mg/0.3 mL IJ SOAJ injection Inject 0.3 mLs (0.3 mg total) into the muscle as needed for anaphylaxis. 12/04/18   Betancourt, Aura Fey, NP  FLUoxetine (PROZAC) 40 MG capsule  02/14/18   [provider]  levothyroxine (SYNTHROID) 125 MCG tablet Take 125 mcg by mouth daily. 10/12/18   [provider]  meclizine (ANTIVERT) 25 MG tablet TAKE 1 TABLET (25 MG TOTAL) BY MOUTH 2 (TWO) TIMES DAILY AS NEEDED. 01/30/19   Betancourt, Aura Fey, NP  pantoprazole (PROTONIX) 40 MG tablet Take 1 tablet (40 mg total) by mouth daily. 11/05/18   Jan Fireman, PA-C  topiramate (TOPAMAX) 25 MG tablet TAKE UP TO 4 TABLETS A DAY 10/08/15   Mar Daring, PA-C    Allergies Bee venom, Fluconazole, Latex, and Lidocaine  Family History  Problem Relation Age of Onset  . Breast cancer Paternal Aunt 44  . Hypothyroidism Mother   . Cancer Father   . Heart failure Father   . Depression Sister   . Ovarian cancer Maternal Grandmother 58  . Pancreatic cancer Maternal Grandfather 49  . Breast cancer Paternal Aunt 37  . Colon cancer Maternal Aunt  50    Social History Social History   Tobacco Use  . Smoking status: Never Smoker  . Smokeless tobacco: Never Used  Substance Use Topics  . Alcohol use: Not Currently  . Drug use: Not Currently    Review of Systems  Constitutional: No fever/chills Eyes: No visual changes. ENT: No sore throat. Cardiovascular: Positive for chest pain. Respiratory: Positive for shortness of breath. Gastrointestinal: No abdominal pain.  No nausea, no vomiting.  No diarrhea.  No constipation. Genitourinary: Negative for dysuria. Musculoskeletal: Negative for back pain. Skin:  Negative for rash. Neurological: Negative for headaches, focal weakness or numbness.   ____________________________________________   PHYSICAL EXAM:  VITAL SIGNS: ED Triage Vitals  Enc Vitals Group     BP 02/02/19 2238 (!) 173/92     Pulse Rate 02/02/19 2238 86     Resp 02/02/19 2238 (!) 22     Temp --      Temp Source 02/02/19 2238 Oral     SpO2 --      Weight 02/02/19 2239 220 lb (99.8 kg)     Height 02/02/19 2239 5' 5"  (1.651 m)     Head Circumference --      Peak Flow --      Pain Score 02/02/19 2238 0     Pain Loc --      Pain Edu? --      Excl. in [employer]? --     Constitutional: Alert and oriented.  Anxious appearing and in mild acute distress. Eyes: Conjunctivae are normal. PERRL. EOMI. Head: Atraumatic. Nose: No congestion/rhinnorhea. Mouth/Throat: Mucous membranes are moist.  Oropharynx non-erythematous. Neck: No stridor.   Cardiovascular: Normal rate, regular rhythm. Grossly normal heart sounds.  Good peripheral circulation. Respiratory: Normal respiratory effort.  No retractions. Lungs CTAB. Gastrointestinal: Soft and nontender. No distention. No abdominal bruits. No CVA tenderness. Musculoskeletal: No lower extremity tenderness nor edema.  No joint effusions. Neurologic:  Normal speech and language. No gross focal neurologic deficits are appreciated. No gait instability. Skin:  Skin is warm, dry and intact. No rash noted. Psychiatric: Mood and affect are normal. Speech and behavior are normal.  ____________________________________________   LABS (all labs ordered are listed, but only abnormal results are displayed)  Labs Reviewed  BASIC METABOLIC PANEL - Abnormal; Notable for the following components:      Result Value   Glucose, Bld 128 (*)    All other components within normal limits  CBC - Abnormal; Notable for the following components:   Hemoglobin 11.5 (*)    HCT 35.8 (*)    All other components within normal limits  TROPONIN I (HIGH SENSITIVITY) -  Abnormal; Notable for the following components:   Troponin I (High Sensitivity) 185 (*)    All other components within normal limits  SARS CORONAVIRUS 2 (TAT 6-24 HRS)  HEPARIN LEVEL (UNFRACTIONATED)  APTT  PROTIME-INR  HIV ANTIBODY (ROUTINE TESTING W REFLEX)  LIPID PANEL  TROPONIN I (HIGH SENSITIVITY)   ____________________________________________  EKG  ED ECG REPORT I, Sedonia Kitner J, the attending physician, personally viewed and interpreted this ECG.   Date: 02/02/2019  EKG Time: 2242  Rate: 75  Rhythm: normal EKG, normal sinus rhythm  Axis: Normal  Intervals:none  ST&T Change: Nonspecific  ____________________________________________  RADIOLOGY  ED MD interpretation: No acute cardiopulmonary process  Official radiology report(s): DG Chest 2 View  Result Date: 02/02/2019 CLINICAL DATA:  Hypertension, shortness of breath for 1 day EXAM: CHEST - 2 VIEW COMPARISON:  Radiograph 05/07/2006 FINDINGS:  No consolidation, features of edema, pneumothorax, or effusion. Pulmonary vascularity is normally distributed. The cardiomediastinal contours are unremarkable. No acute osseous or soft tissue abnormality. Degenerative changes are present in the imaged spine and shoulders. Cholecystectomy clips in the right upper quadrant IMPRESSION: No acute cardiopulmonary abnormality. Electronically Signed   By: Lovena Le M.D.   On: 02/02/2019 23:27    ____________________________________________   PROCEDURES  Procedure(s) performed (including Critical Care):  Procedures  CRITICAL CARE Performed by: Paulette Blanch   Total critical care time: 45 minutes  Critical care time was exclusive of separately billable procedures and treating other patients.  Critical care was necessary to treat or prevent imminent or life-threatening deterioration.  Critical care was time spent personally by me on the following activities: development of treatment plan with patient and/or surrogate as well as  nursing, discussions with consultants, evaluation of patient's response to treatment, examination of patient, obtaining history from patient or surrogate, ordering and performing treatments and interventions, ordering and review of laboratory studies, ordering and review of radiographic studies, pulse oximetry and re-evaluation of patient's condition.  ____________________________________________   INITIAL IMPRESSION / ASSESSMENT AND PLAN / ED COURSE  As part of my medical decision making, I reviewed the following data within the [facility] notes reviewed and incorporated, Labs reviewed, EKG interpreted, Old chart reviewed, Radiograph reviewed, Discussed with admitting physician and Notes from prior ED visits     Carly Norton was evaluated in Emergency Department on 02/03/2019 for the symptoms described in the history of present illness. She was evaluated in the context of the global COVID-19 pandemic, which necessitated consideration that the patient might be at risk for infection with the SARS-CoV-2 virus that causes COVID-19. Institutional protocols and algorithms that pertain to the evaluation of patients at risk for COVID-19 are in a state of rapid change based on information released by regulatory bodies including the CDC and federal and state organizations. These policies and algorithms were followed during the patient's care in the ED.    48 year old female who presents with elevated blood pressure and chest pain. Differential diagnosis includes, but is not limited to, ACS, aortic dissection, pulmonary embolism, cardiac tamponade, pneumothorax, pneumonia, pericarditis, myocarditis, GI-related causes including esophagitis/gastritis, and musculoskeletal chest wall pain.    Initial troponin is 185.  Will start aspirin, nitroglycerin, heparin bolus and drip.  Will discuss with hospitalist services to evaluate patient in the emergency department for admission.       ____________________________________________   FINAL CLINICAL IMPRESSION(S) / ED DIAGNOSES  Final diagnoses:  Essential hypertension  Chest pain due to myocardial ischemia, unspecified ischemic chest pain type  NSTEMI (non-ST elevated myocardial infarction) [facility])     ED Discharge Orders    None       Note:  This document was prepared using Dragon voice recognition software and may include unintentional dictation errors.   Paulette Blanch, MD 02/03/19 [phone]

## 2019-02-03 ENCOUNTER — Other Ambulatory Visit: Payer: Self-pay

## 2019-02-03 ENCOUNTER — Encounter: Payer: Self-pay | Admitting: Internal Medicine

## 2019-02-03 DIAGNOSIS — I160 Hypertensive urgency: Secondary | ICD-10-CM

## 2019-02-03 DIAGNOSIS — I10 Essential (primary) hypertension: Secondary | ICD-10-CM | POA: Diagnosis not present

## 2019-02-03 DIAGNOSIS — I214 Non-ST elevation (NSTEMI) myocardial infarction: Secondary | ICD-10-CM | POA: Diagnosis not present

## 2019-02-03 DIAGNOSIS — F419 Anxiety disorder, unspecified: Secondary | ICD-10-CM

## 2019-02-03 DIAGNOSIS — I248 Other forms of acute ischemic heart disease: Secondary | ICD-10-CM

## 2019-02-03 LAB — LIPID PANEL
Cholesterol: 229 mg/dL — ABNORMAL HIGH (ref 0–200)
HDL: 48 mg/dL (ref >40)
LDL Cholesterol: 102 mg/dL — ABNORMAL HIGH (ref 0–99)
Total CHOL/HDL Ratio: 4.8 RATIO
Triglycerides: 395 mg/dL — ABNORMAL HIGH (ref <150)
VLDL: 79 mg/dL — ABNORMAL HIGH (ref 0–40)

## 2019-02-03 LAB — HIV ANTIBODY (ROUTINE TESTING W REFLEX): HIV Screen 4th Generation wRfx: NONREACTIVE

## 2019-02-03 LAB — PROTIME-INR
INR: 1 (ref 0.8–1.2)
Prothrombin Time: 13.1 seconds (ref 11.4–15.2)

## 2019-02-03 LAB — HEPARIN LEVEL (UNFRACTIONATED)
Heparin Unfractionated: 0.2 IU/mL — ABNORMAL LOW (ref 0.30–0.70)
Heparin Unfractionated: 0.1 IU/mL — ABNORMAL LOW (ref 0.30–0.70)
Heparin Unfractionated: 0.12 IU/mL — ABNORMAL LOW (ref 0.30–0.70)

## 2019-02-03 LAB — APTT: aPTT: 46 seconds — ABNORMAL HIGH (ref 24–36)

## 2019-02-03 LAB — SARS CORONAVIRUS 2 (TAT 6-24 HRS): SARS Coronavirus 2: NEGATIVE

## 2019-02-03 LAB — TSH: TSH: 7.152 u[IU]/mL — ABNORMAL HIGH (ref 0.350–4.500)

## 2019-02-03 LAB — TROPONIN I (HIGH SENSITIVITY): Troponin I (High Sensitivity): 117 ng/L (ref <18)

## 2019-02-03 MED ORDER — NITROGLYCERIN 0.4 MG SL SUBL: 0.4000 mg | SUBLINGUAL_TABLET | SUBLINGUAL | Status: DC | PRN

## 2019-02-03 MED ORDER — HEPARIN BOLUS VIA INFUSION
1150.0000 [IU] | Freq: Once | INTRAVENOUS | Status: AC
  Administered 2019-02-03: 1150 [IU] via INTRAVENOUS
  Filled 2019-02-03: qty 1150

## 2019-02-03 MED ORDER — HEPARIN (PORCINE) 25000 UT/250ML-% IV SOLN
1150.0000 [IU]/h | INTRAVENOUS | Status: DC
  Administered 2019-02-03: 1150 [IU]/h via INTRAVENOUS

## 2019-02-03 MED ORDER — CARVEDILOL 3.125 MG PO TABS
3.1250 mg | ORAL_TABLET | Freq: Two times a day (BID) | ORAL | Status: DC
  Administered 2019-02-03 – 2019-02-05 (×4): 3.125 mg via ORAL
  Filled 2019-02-03 (×5): qty 1

## 2019-02-03 MED ORDER — BUSPIRONE HCL 10 MG PO TABS
10.0000 mg | ORAL_TABLET | Freq: Every day | ORAL | Status: DC
  Administered 2019-02-04 – 2019-02-05 (×2): 10 mg via ORAL
  Filled 2019-02-03: qty 2
  Filled 2019-02-03: qty 1
  Filled 2019-02-03: qty 2

## 2019-02-03 MED ORDER — HYDRALAZINE HCL 20 MG/ML IJ SOLN: 20.0000 mg | Freq: Four times a day (QID) | INTRAMUSCULAR | Status: DC | PRN

## 2019-02-03 MED ORDER — ASPIRIN 81 MG PO CHEW: 324.0000 mg | CHEWABLE_TABLET | ORAL | Status: AC

## 2019-02-03 MED ORDER — ACETAMINOPHEN 325 MG PO TABS
650.0000 mg | ORAL_TABLET | ORAL | Status: DC | PRN
  Administered 2019-02-03: 650 mg via ORAL
  Filled 2019-02-03: qty 2

## 2019-02-03 MED ORDER — HEPARIN BOLUS VIA INFUSION
2400.0000 [IU] | Freq: Once | INTRAVENOUS | Status: AC
  Administered 2019-02-03: 2400 [IU] via INTRAVENOUS
  Filled 2019-02-03: qty 2400

## 2019-02-03 MED ORDER — FLUOXETINE HCL 20 MG PO CAPS
40.0000 mg | ORAL_CAPSULE | Freq: Every day | ORAL | Status: DC
  Administered 2019-02-04 – 2019-02-05 (×2): 40 mg via ORAL
  Filled 2019-02-03 (×3): qty 2

## 2019-02-03 MED ORDER — IBUPROFEN 400 MG PO TABS
400.0000 mg | ORAL_TABLET | Freq: Four times a day (QID) | ORAL | Status: DC | PRN
  Administered 2019-02-03 – 2019-02-05 (×4): 400 mg via ORAL
  Filled 2019-02-03 (×4): qty 1

## 2019-02-03 MED ORDER — LEVOTHYROXINE SODIUM 25 MCG PO TABS
125.0000 ug | ORAL_TABLET | Freq: Every day | ORAL | Status: DC
  Administered 2019-02-04 – 2019-02-05 (×2): 125 ug via ORAL
  Filled 2019-02-03: qty 3
  Filled 2019-02-03: qty 1

## 2019-02-03 MED ORDER — HEPARIN (PORCINE) 25000 UT/250ML-% IV SOLN
1450.0000 [IU]/h | INTRAVENOUS | Status: DC
  Administered 2019-02-03: 1450 [IU]/h via INTRAVENOUS
  Administered 2019-02-03: 1150 [IU]/h via INTRAVENOUS
  Filled 2019-02-03: qty 250

## 2019-02-03 MED ORDER — ASPIRIN 300 MG RE SUPP: 300.0000 mg | RECTAL | Status: AC

## 2019-02-03 MED ORDER — ATORVASTATIN CALCIUM 20 MG PO TABS
40.0000 mg | ORAL_TABLET | Freq: Every day | ORAL | Status: DC
  Administered 2019-02-04: 40 mg via ORAL
  Filled 2019-02-03: qty 2

## 2019-02-03 MED ORDER — ACETAMINOPHEN 325 MG PO TABS
650.0000 mg | ORAL_TABLET | Freq: Four times a day (QID) | ORAL | Status: DC | PRN
  Administered 2019-02-03 – 2019-02-04 (×2): 650 mg via ORAL
  Filled 2019-02-03 (×2): qty 2

## 2019-02-03 MED ORDER — ACETAMINOPHEN 500 MG PO TABS: 1000.0000 mg | ORAL_TABLET | Freq: Four times a day (QID) | ORAL | Status: DC | PRN

## 2019-02-03 MED ORDER — HEPARIN BOLUS VIA INFUSION
4000.0000 [IU] | Freq: Once | INTRAVENOUS | Status: DC
  Filled 2019-02-03: qty 4000

## 2019-02-03 MED ORDER — HEPARIN (PORCINE) 25000 UT/250ML-% IV SOLN
1000.0000 [IU]/h | INTRAVENOUS | Status: DC
  Administered 2019-02-03: 1000 [IU]/h via INTRAVENOUS

## 2019-02-03 MED ORDER — ASPIRIN EC 81 MG PO TBEC
81.0000 mg | DELAYED_RELEASE_TABLET | Freq: Every day | ORAL | Status: DC
  Administered 2019-02-04 – 2019-02-05 (×2): 81 mg via ORAL
  Filled 2019-02-03 (×2): qty 1

## 2019-02-03 MED ORDER — ONDANSETRON HCL 4 MG/2ML IJ SOLN
4.0000 mg | Freq: Four times a day (QID) | INTRAMUSCULAR | Status: DC | PRN
  Administered 2019-02-03: 4 mg via INTRAVENOUS
  Filled 2019-02-03: qty 2

## 2019-02-03 NOTE — H&P (Signed)
History and Physical    Carly Norton:[id number] DOB: 1970/07/14 DOA: 02/02/2019  PCP: Chad Cordial, PA-C  Patient coming from: home  I have personally briefly reviewed patient's old medical records in [place]  Chief Complaint: feeling flushed, high blood pressure reading HPI: Carly Norton is a 48 y.o. 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  She has no prior history of hypertension.  He denies chest pains, shortness of breath, nausea or vomiting.  She denies palpitations headache or blurred vision, cough or fever. ED Course: On arrival in the emergency room she was afebrile with blood pressure of 173/92 heart rate 86 oxygen saturation 100% on room air.  EKG showed nonspecific ST-T wave changes.  Troponin returned at 185.  Blood work was unremarkable.  She was treated with chewable aspirin topical nitroglycerin and started on a heparin infusion and hospitalist consulted for admission.  Review of Systems: As per HPI otherwise 10 point review of systems negative.   Past Medical History:  Diagnosis Date  . Anxiety and depression   . BRCA negative 05/2018   MyRisk neg except ATM and MSH2 VUS  . Common migraine   . Family history of breast cancer   . Family history of ovarian cancer   . H/O benign breast biopsy   . Hypothyroidism   . Increased risk of breast cancer 05/2018   IBIS=22.5%/riskscore=16.2%  . LGSIL on Pap smear of cervix 1998  . Menorrhagia   . Vertigo     Past Surgical History:  Procedure Laterality Date  . BREAST BIOPSY Left 2014   benign  . BREAST BIOPSY Left 04/01/2015   path pending  . TUBAL LIGATION    .  VAGINAL HYSTERECTOMY  2002   Dr. Rayford Halsted; due to menorrhagia/anemia     reports that she has never smoked. She has never used smokeless tobacco. She reports previous alcohol use. She reports previous drug use.  Allergies  Allergen Reactions  . Bee Venom Anaphylaxis  . Fluconazole Rash and Swelling  . Lidocaine     unknown  . Latex Swelling and Rash    Family History  Problem Relation Age of Onset  . Breast cancer Paternal Aunt 37  . Hypothyroidism Mother   . Cancer Father   . Heart failure Father   . Depression Sister   . Ovarian cancer Maternal Grandmother 60  . Pancreatic cancer Maternal Grandfather 54  . Breast cancer Paternal Aunt 72  . Colon cancer Maternal Aunt 49     Prior to Admission medications   Medication Sig Start Date End Date Taking? Authorizing Provider  busPIRone (BUSPAR) 10 MG tablet Take by mouth daily.  02/13/18  Yes [provider]  eletriptan (RELPAX) 40 MG tablet Take 1 tablet po as a single dose.  May repeat dose once in two hours 01/29/19  Yes Sable Feil, PA-C  EPINEPHrine 0.3 mg/0.3 mL IJ SOAJ injection Inject 0.3 mLs (0.3 mg total) into the muscle as needed for anaphylaxis. 12/04/18  Yes Betancourt, Aura Fey, NP  FLUoxetine (PROZAC) 40 MG capsule Take 40 mg by mouth daily.  02/14/18  Yes [provider]  levothyroxine (SYNTHROID) 125  MCG tablet Take 125 mcg by mouth daily. 10/12/18  Yes [provider]  meclizine (ANTIVERT) 25 MG tablet TAKE 1 TABLET (25 MG TOTAL) BY MOUTH 2 (TWO) TIMES DAILY AS NEEDED. 01/30/19  Yes Betancourt, Tina A, NP  pantoprazole (PROTONIX) 40 MG tablet Take 1 tablet (40 mg total) by mouth daily. 11/05/18  Yes Jan Fireman, PA-C  topiramate (TOPAMAX) 25 MG tablet TAKE UP TO 4 TABLETS A DAY 10/08/15  Yes Mar Daring, PA-C  Multiple Vitamin (DAILY VITAMINS) tablet Take 1 tablet by mouth daily.    [provider]    Physical Exam: Vitals:   02/02/19 2238 02/02/19 2239 02/02/19 2300    BP: (!) 173/92    Pulse: 86    Resp: (!) 22    TempSrc: Oral    SpO2:   100%  Weight:  99.8 kg   Height:  _0  (1.651 m)      Vitals:   02/02/19 2238 02/02/19 2239 02/02/19 2300  BP: (!) 173/92    Pulse: 86    Resp: (!) 22    TempSrc: Oral    SpO2:   100%  Weight:  99.8 kg   Height:  _1  (1.651 m)     Constitutional: NAD, alert and oriented x 3 Eyes: PERRL, lids and conjunctivae normal ENMT: Mucous membranes are moist.  Neck: normal, supple, no masses, no thyromegaly Respiratory: clear to auscultation bilaterally, no wheezing, no crackles. Normal respiratory effort. No accessory muscle use.  Cardiovascular: Regular rate and rhythm, no murmurs / rubs / gallops. No extremity edema. 2+ pedal pulses. No carotid bruits.  Abdomen: no tenderness, no masses palpated. No hepatosplenomegaly. Bowel sounds positive.  Musculoskeletal: no clubbing / cyanosis. No joint deformity upper and lower extremities.  Skin: no rashes, lesions, ulcers.  Neurologic: No gross focal neurologic deficit. Psychiatric: Normal mood and affect.   Labs on Admission: I have personally reviewed following labs and imaging studies  CBC: Recent Labs  Lab 02/02/19 2248  WBC 9.7  HGB 11.5*  HCT 35.8*  MCV 83.1  PLT 591   Basic Metabolic Panel: Recent Labs  Lab 02/02/19 2248  NA 140  K 3.6  CL 106  CO2 25  GLUCOSE 128*  BUN 11  CREATININE 0.82  CALCIUM 8.9   GFR: Estimated Creatinine Clearance: 97.1 mL/min (by C-G formula based on SCr of 0.82 mg/dL). Liver Function Tests: No results for input(s): AST, ALT, ALKPHOS, BILITOT, PROT, ALBUMIN in the last 168 hours. No results for input(s): LIPASE, AMYLASE in the last 168 hours. No results for input(s): AMMONIA in the last 168 hours. Coagulation Profile: No results for input(s): INR, PROTIME in the last 168 hours. Cardiac Enzymes: No results for input(s): CKTOTAL, CKMB, CKMBINDEX, TROPONINI in the last 168 hours. BNP (last 3 results) No  results for input(s): PROBNP in the last 8760 hours. HbA1C: No results for input(s): HGBA1C in the last 72 hours. CBG: No results for input(s): GLUCAP in the last 168 hours. Lipid Profile: No results for input(s): CHOL, HDL, LDLCALC, TRIG, CHOLHDL, LDLDIRECT in the last 72 hours. Thyroid Function Tests: No results for input(s): TSH, T4TOTAL, FREET4, T3FREE, THYROIDAB in the last 72 hours. Anemia Panel: No results for input(s): VITAMINB12, FOLATE, FERRITIN, TIBC, IRON, RETICCTPCT in the last 72 hours. Urine analysis:    Component Value Date/Time   COLORURINE Yellow 07/06/2013 2111   APPEARANCEUR Clear 07/06/2013 2111   LABSPEC 1.011 07/06/2013 2111   PHURINE 5.0 07/06/2013 2111   GLUCOSEU  Negative 07/06/2013 2111   HGBUR 2+ 07/06/2013 2111   BILIRUBINUR Negative 07/06/2013 2111   KETONESUR Negative 07/06/2013 2111   PROTEINUR Negative 07/06/2013 2111   NITRITE Negative 07/06/2013 2111   LEUKOCYTESUR Negative 07/06/2013 2111    Radiological Exams on Admission: DG Chest 2 View  Result Date: 02/02/2019 CLINICAL DATA:  Hypertension, shortness of breath for 1 day EXAM: CHEST - 2 VIEW COMPARISON:  Radiograph 05/07/2006 FINDINGS: No consolidation, features of edema, pneumothorax, or effusion. Pulmonary vascularity is normally distributed. The cardiomediastinal contours are unremarkable. No acute osseous or soft tissue abnormality. Degenerative changes are present in the imaged spine and shoulders. Cholecystectomy clips in the right upper quadrant IMPRESSION: No acute cardiopulmonary abnormality. Electronically Signed   By: Lovena Le M.D.   On: 02/02/2019 23:27    EKG: Independently reviewed.   Assessment/Plan Principal Problem:   NSTEMI (non-ST elevated myocardial infarction) [facility]) patient with family history of premature coronary artery disease in first-degree relative --Patient presented with flushing markedly elevated blood pressure and elevated troponin though without acute EKG  changes. --Continue aspirin statin, nitrates during infusion --Cardiology consult requested --Follow lipid profile    Hypothyroidism --Follow TSH    Hypertensive urgency --Patient may have undiagnosed hypertension --Pressure at the time of admission had normalized --Continue to monitor     DVT prophylaxis: none as patient on heparin  Code Status: full  Disposition Plan: Back to previous home environment Consults called: Dr. Clayborn Bigness via Raeanne Gathers, cardiology     Athena Masse MD [facility]     02/03/2019, 2:57 AM

## 2019-02-03 NOTE — ED Notes (Signed)
Assisted pt to toilet to urinate. 

## 2019-02-03 NOTE — ED Notes (Signed)
Patient complaining of headache. Given Tylenol per orders. Will continue to monitor.

## 2019-02-03 NOTE — Progress Notes (Signed)
[place] for Heparin Indication: chest pain/ACS  Allergies  Allergen Reactions  . Bee Venom Anaphylaxis  . Fluconazole Rash and Swelling  . Lidocaine     unknown  . Latex Swelling and Rash    Patient Measurements: Height: _0  (165.1 cm) Weight: 220 lb (99.8 kg) IBW/kg (Calculated) : 57 HEPARIN DW (KG): 79.8  Vital Signs: Temp: 97.9 F (36.6 C) (12/28 1926) Temp Source: Oral (12/28 1926) BP: 149/82 (12/28 1926) Pulse Rate: 61 (12/28 1926)  Labs: Recent Labs    02/02/19 2248 02/03/19 0722 02/03/19 1527 02/03/19 2016  HGB 11.5*  --   --   --   HCT 35.8*  --   --   --   PLT 290  --   --   --   APTT  --  46*  --   --   LABPROT  --  13.1  --   --   INR  --  1.0  --   --   HEPARINUNFRC  --  0.20* 0.10* 0.12*  CREATININE 0.82  --   --   --   TROPONINIHS 185* 117*  --   --    Estimated Creatinine Clearance: 97.1 mL/min (by C-G formula based on SCr of 0.82 mg/dL).  Medical History: Past Medical History:  Diagnosis Date  . Anxiety and depression   . BRCA negative 05/2018   MyRisk neg except ATM and MSH2 VUS  . Common migraine   . Family history of breast cancer   . Family history of ovarian cancer   . H/O benign breast biopsy   . Hypothyroidism   . Increased risk of breast cancer 05/2018   IBIS=22.5%/riskscore=16.2%  . LGSIL on Pap smear of cervix 1998  . Menorrhagia   . Vertigo     Medications:  Medications Prior to Admission  Medication Sig Dispense Refill Last Dose  . busPIRone (BUSPAR) 10 MG tablet Take by mouth daily.    02/02/2019 at 1630  . eletriptan (RELPAX) 40 MG tablet Take 1 tablet po as a single dose.  May repeat dose once in two hours 12 tablet 2 prn at prn  . EPINEPHrine 0.3 mg/0.3 mL IJ SOAJ injection Inject 0.3 mLs (0.3 mg total) into the muscle as needed for anaphylaxis. 1 each 0 prn at prn  . FLUoxetine (PROZAC) 40 MG capsule Take 40 mg by mouth daily.    02/02/2019 at 1630  . levothyroxine  (SYNTHROID) 125 MCG tablet Take 125 mcg by mouth daily.   02/02/2019 at 1630  . meclizine (ANTIVERT) 25 MG tablet TAKE 1 TABLET (25 MG TOTAL) BY MOUTH 2 (TWO) TIMES DAILY AS NEEDED. 30 tablet 0 Past Week at Unknown time  . pantoprazole (PROTONIX) 40 MG tablet Take 1 tablet (40 mg total) by mouth daily. 90 tablet 0 02/02/2019 at 1630  . topiramate (TOPAMAX) 25 MG tablet TAKE UP TO 4 TABLETS A DAY 120 tablet 3 02/02/2019 at 1630  . Multiple Vitamin (DAILY VITAMINS) tablet Take 1 tablet by mouth daily.       Assessment: Pharmacy asked to initiate and monitor Heparin for ACS.  Baseline labs ordered.  No anticoagulants PTA per current med list.   12/28  _1  HL 0.20. Subtherapeutic. Confirmed with ED nurse no heparin interruptions or issues in the the line.    12/28: HL @ 1527 = 0.1.   Heparin gtt was stopped for 1 hr .   Will recheck HL 6 hrs after drip  restart on 12/28 @ 2000.  12/28:  HL @ 2016 = 0.12    Goal of Therapy:  Heparin level 0.3-0.7 units/ml Monitor platelets by anticoagulation protocol: Yes   Plan:  12/18:  HL @ 2016 = 0.12 Will order Heparin 2400 units IV X 1 bolus and increase drip rate to 1450 units/hr. Will recheck HL 6 hrs after rate change.   Averly Ericson D 02/03/2019,9:14 PM

## 2019-02-03 NOTE — Progress Notes (Signed)
ANTICOAGULATION CONSULT NOTE - Initial Consult  Pharmacy Consult for Heparin Indication: chest pain/ACS  Allergies  Allergen Reactions  . Bee Venom Anaphylaxis  . Fluconazole Rash and Swelling  . Latex   . Lidocaine     Patient Measurements: Height: 5' 5"  (165.1 cm) Weight: 220 lb (99.8 kg) IBW/kg (Calculated) : 57 HEPARIN DW (KG): 79.8  Vital Signs: Temp Source: Oral (12/27 2238) BP: 173/92 (12/27 2238) Pulse Rate: 86 (12/27 2238)  Labs: Recent Labs    02/02/19 2248  HGB 11.5*  HCT 35.8*  PLT 290  CREATININE 0.82  TROPONINIHS 185*   Estimated Creatinine Clearance: 97.1 mL/min (by C-G formula based on SCr of 0.82 mg/dL).  Medical History: Past Medical History:  Diagnosis Date  . Anxiety and depression   . BRCA negative 05/2018   MyRisk neg except ATM and MSH2 VUS  . Common migraine   . Family history of breast cancer   . Family history of ovarian cancer   . H/O benign breast biopsy   . Hypothyroidism   . Increased risk of breast cancer 05/2018   IBIS=22.5%/riskscore=16.2%  . LGSIL on Pap smear of cervix 1998  . Menorrhagia   . Vertigo     Medications:  (Not in a hospital admission)   Assessment: Pharmacy asked to initiate and monitor Heparin for ACS.  Baseline labs ordered.  No anticoagulants PTA per current med list.   Goal of Therapy:  Heparin level 0.3-0.7 units/ml Monitor platelets by anticoagulation protocol: Yes   Plan:  Heparin 4000 units bolus x 1 then infusion at 1000 units/hr Check HL in 6 hours  Hart Robinsons A 02/03/2019,12:07 AM

## 2019-02-03 NOTE — ED Notes (Signed)
Answered pts call bell. Assisted pt to toilet to urinate. 

## 2019-02-03 NOTE — ED Notes (Signed)
AAOx3.  Skin warm and dry.  NAD 

## 2019-02-03 NOTE — Progress Notes (Signed)
PROGRESS NOTE    Carly Norton  DZH:[id number] DOB: [birth year] DOA: 41/96/2229 PCP: Chad Cordial, PA-C      Assessment & Plan:   Principal Problem:   NSTEMI (non-ST elevated myocardial infarction) [facility]) Active Problems:   Hypothyroidism   Hypertensive urgency   Anxiety  NSTEMI: patient with family history of premature coronary artery disease in first-degree relative. Troponins are elevated. Continue on aspirin, statin, carvedilol & IV heparin. Zofran prn for nausea & vomiting. Continue on tele. Cardio consulted  Hypertensive urgency: may have undiagnosed hypertension. Continue on carvedilol. IV hydralazine prn   Hyperglycemia: no hx of DM but will check HbA1c secondary to BMI 36.6  Obesity: BMI 36.6. Would benefit from weight loss   Hypothyroidism: will continue on home dose of levothyroxine  Depression: severity unknown. Will continue on home dose of fluoxetine, buspirone  DVT prophylaxis: IV heparin  Code Status: full  Family Communication:  Disposition Plan:   Consultants:   cardio   Procedures:   Antimicrobials:   Subjective: Pt c/o nausea.   Objective: Vitals:   02/03/19 0630 02/03/19 0645 02/03/19 0700 02/03/19 0715  BP: (!) 181/92  (!) 164/97   Pulse: 70 80 62 62  Resp: 16 20 13 15   TempSrc:      SpO2: 98% 98% 95% 95%  Weight:      Height:       No intake or output data in the 24 hours ending 02/03/19 0833 Filed Weights   02/02/19 2239  Weight: 99.8 kg    Examination:  General exam: Appears calm but uncomfortable  Respiratory system: Clear to auscultation. Respiratory effort normal. Cardiovascular system: S1 & S2 normal.  No rubs, gallops or clicks.  Gastrointestinal system: Abdomen is obese, soft and nontender. Normal bowel sounds heard. Central nervous system: Alert and oriented. Moves all 4 extremities Psychiatry: Judgement and insight appear normal. Flat mood and affect     Data Reviewed: I have personally reviewed  following labs and imaging studies  CBC: Recent Labs  Lab 02/02/19 2248  WBC 9.7  HGB 11.5*  HCT 35.8*  MCV 83.1  PLT 798   Basic Metabolic Panel: Recent Labs  Lab 02/02/19 2248  NA 140  K 3.6  CL 106  CO2 25  GLUCOSE 128*  BUN 11  CREATININE 0.82  CALCIUM 8.9   GFR: Estimated Creatinine Clearance: 97.1 mL/min (by C-G formula based on SCr of 0.82 mg/dL). Liver Function Tests: No results for input(s): AST, ALT, ALKPHOS, BILITOT, PROT, ALBUMIN in the last 168 hours. No results for input(s): LIPASE, AMYLASE in the last 168 hours. No results for input(s): AMMONIA in the last 168 hours. Coagulation Profile: Recent Labs  Lab 02/03/19 0722  INR 1.0   Cardiac Enzymes: No results for input(s): CKTOTAL, CKMB, CKMBINDEX, TROPONINI in the last 168 hours. BNP (last 3 results) No results for input(s): PROBNP in the last 8760 hours. HbA1C: No results for input(s): HGBA1C in the last 72 hours. CBG: No results for input(s): GLUCAP in the last 168 hours. Lipid Profile: Recent Labs    02/03/19 0722  CHOL 229*  HDL 48  LDLCALC 102*  TRIG 395*  CHOLHDL 4.8   Thyroid Function Tests: Recent Labs    02/03/19 0722  TSH 7.152*   Anemia Panel: No results for input(s): VITAMINB12, FOLATE, FERRITIN, TIBC, IRON, RETICCTPCT in the last 72 hours. Sepsis Labs: No results for input(s): PROCALCITON, LATICACIDVEN in the last 168 hours.  No results found for this or any previous  visit (from the past 240 hour(s)).       Radiology Studies: DG Chest 2 View  Result Date: 02/02/2019 CLINICAL DATA:  Hypertension, shortness of breath for 1 day EXAM: CHEST - 2 VIEW COMPARISON:  Radiograph 05/07/2006 FINDINGS: No consolidation, features of edema, pneumothorax, or effusion. Pulmonary vascularity is normally distributed. The cardiomediastinal contours are unremarkable. No acute osseous or soft tissue abnormality. Degenerative changes are present in the imaged spine and shoulders.  Cholecystectomy clips in the right upper quadrant IMPRESSION: No acute cardiopulmonary abnormality. Electronically Signed   By: Kreg Shropshire M.D.   On: 02/02/2019 23:27        Scheduled Meds: . aspirin  324 mg Oral NOW   Or  . aspirin  300 mg Rectal NOW  . [START ON 02/04/2019] aspirin EC  81 mg Oral Daily  . atorvastatin  40 mg Oral q1800  . carvedilol  3.125 mg Oral BID WC  . heparin  1,150 Units Intravenous Once  . heparin  4,000 Units Intravenous Once   Continuous Infusions: . heparin       LOS: 0 days    Time spent: 30 mins    Charise Killian, MD [facility] Pager [phone]  If 7PM-7AM, please contact night-coverage [email or web address] Password [facility] 02/03/2019, 8:33 AM

## 2019-02-03 NOTE — ED Notes (Signed)
Patient assisted to commode. Patient is tearful because she still has a headache. Asked patient to use call bell system when she needed to get back to bed.

## 2019-02-03 NOTE — Plan of Care (Signed)
  Problem: Education: Goal: Knowledge of General Education information will improve Description: Including pain rating scale, medication(s)/side effects and non-pharmacologic comfort measures Outcome: Progressing   Problem: Health Behavior/Discharge Planning: Goal: Ability to manage health-related needs will improve Outcome: Progressing   Problem: Activity: Goal: Risk for activity intolerance will decrease Outcome: Progressing   Problem: Nutrition: Goal: Adequate nutrition will be maintained Outcome: Progressing   Problem: Pain Managment: Goal: General experience of comfort will improve Outcome: Progressing    Patient is suffering from a possible migraine but cardiologist do not recommend any anti migraine medications that will increase blood pressure. Will administer tylenol

## 2019-02-03 NOTE — ED Notes (Signed)
Pt given new blanket and toothbrush/toothpaste per request.

## 2019-02-03 NOTE — Progress Notes (Signed)
[facility] for Heparin Indication: chest pain/ACS  Allergies  Allergen Reactions  . Bee Venom Anaphylaxis  . Fluconazole Rash and Swelling  . Lidocaine     unknown  . Latex Swelling and Rash    Patient Measurements: Height: 5' 5"  (165.1 cm) Weight: 220 lb (99.8 kg) IBW/kg (Calculated) : 57 HEPARIN DW (KG): 79.8  Vital Signs: Temp Source: Oral (12/27 2238) BP: 164/97 (12/28 0700) Pulse Rate: 62 (12/28 0715)  Labs: Recent Labs    02/02/19 2248 02/03/19 0722  HGB 11.5*  --   HCT 35.8*  --   PLT 290  --   APTT  --  46*  LABPROT  --  13.1  INR  --  1.0  HEPARINUNFRC  --  0.20*  CREATININE 0.82  --   TROPONINIHS 185*  --    Estimated Creatinine Clearance: 97.1 mL/min (by C-G formula based on SCr of 0.82 mg/dL).  Medical History: Past Medical History:  Diagnosis Date  . Anxiety and depression   . BRCA negative 05/2018   MyRisk neg except ATM and MSH2 VUS  . Common migraine   . Family history of breast cancer   . Family history of ovarian cancer   . H/O benign breast biopsy   . Hypothyroidism   . Increased risk of breast cancer 05/2018   IBIS=22.5%/riskscore=16.2%  . LGSIL on Pap smear of cervix 1998  . Menorrhagia   . Vertigo     Medications:  (Not in a hospital admission)   Assessment: Pharmacy asked to initiate and monitor Heparin for ACS.  Baseline labs ordered.  No anticoagulants PTA per current med list.   12/28  @0722  HL 0.20. Subtherapeutic. Confirmed with ED nurse no heparin interruptions or issues in the the line.    Goal of Therapy:  Heparin level 0.3-0.7 units/ml Monitor platelets by anticoagulation protocol: Yes   Plan:  Heparin 1150 units bolus x 1 then infusion at 1150 units/hr Check HL in 6 hours  Alima Naser R Keilon Ressel 02/03/2019,7:59 AM

## 2019-02-04 ENCOUNTER — Encounter: Payer: Self-pay | Admitting: Cardiology

## 2019-02-04 ENCOUNTER — Encounter
Admission: EM | Disposition: A | Discharge status: Home / Self Care | Payer: Self-pay | Source: Home / Self Care | Attending: Internal Medicine

## 2019-02-04 DIAGNOSIS — Z803 Family history of malignant neoplasm of breast: Secondary | ICD-10-CM | POA: Diagnosis not present

## 2019-02-04 DIAGNOSIS — I248 Other forms of acute ischemic heart disease: Secondary | ICD-10-CM | POA: Diagnosis not present

## 2019-02-04 DIAGNOSIS — Z8249 Family history of ischemic heart disease and other diseases of the circulatory system: Secondary | ICD-10-CM | POA: Diagnosis not present

## 2019-02-04 DIAGNOSIS — R69 Illness, unspecified: Secondary | ICD-10-CM | POA: Diagnosis not present

## 2019-02-04 DIAGNOSIS — F329 Major depressive disorder, single episode, unspecified: Secondary | ICD-10-CM | POA: Diagnosis present

## 2019-02-04 DIAGNOSIS — Z90710 Acquired absence of both cervix and uterus: Secondary | ICD-10-CM | POA: Diagnosis not present

## 2019-02-04 DIAGNOSIS — Z20828 Contact with and (suspected) exposure to other viral communicable diseases: Secondary | ICD-10-CM | POA: Diagnosis not present

## 2019-02-04 DIAGNOSIS — Z8041 Family history of malignant neoplasm of ovary: Secondary | ICD-10-CM | POA: Diagnosis not present

## 2019-02-04 DIAGNOSIS — E039 Hypothyroidism, unspecified: Secondary | ICD-10-CM | POA: Diagnosis not present

## 2019-02-04 DIAGNOSIS — R42 Dizziness and giddiness: Secondary | ICD-10-CM | POA: Diagnosis not present

## 2019-02-04 DIAGNOSIS — E669 Obesity, unspecified: Secondary | ICD-10-CM | POA: Diagnosis not present

## 2019-02-04 DIAGNOSIS — Z6836 Body mass index (BMI) 36.0-36.9, adult: Secondary | ICD-10-CM | POA: Diagnosis not present

## 2019-02-04 DIAGNOSIS — Z79890 Hormone replacement therapy (postmenopausal): Secondary | ICD-10-CM | POA: Diagnosis not present

## 2019-02-04 DIAGNOSIS — Z91040 Latex allergy status: Secondary | ICD-10-CM | POA: Diagnosis not present

## 2019-02-04 DIAGNOSIS — R739 Hyperglycemia, unspecified: Secondary | ICD-10-CM | POA: Diagnosis not present

## 2019-02-04 DIAGNOSIS — Z888 Allergy status to other drugs, medicaments and biological substances status: Secondary | ICD-10-CM | POA: Diagnosis not present

## 2019-02-04 DIAGNOSIS — I160 Hypertensive urgency: Secondary | ICD-10-CM | POA: Diagnosis not present

## 2019-02-04 DIAGNOSIS — Z818 Family history of other mental and behavioral disorders: Secondary | ICD-10-CM | POA: Diagnosis not present

## 2019-02-04 DIAGNOSIS — E876 Hypokalemia: Secondary | ICD-10-CM | POA: Diagnosis present

## 2019-02-04 DIAGNOSIS — E785 Hyperlipidemia, unspecified: Secondary | ICD-10-CM | POA: Diagnosis not present

## 2019-02-04 DIAGNOSIS — F419 Anxiety disorder, unspecified: Secondary | ICD-10-CM | POA: Diagnosis present

## 2019-02-04 DIAGNOSIS — I214 Non-ST elevation (NSTEMI) myocardial infarction: Secondary | ICD-10-CM | POA: Diagnosis not present

## 2019-02-04 DIAGNOSIS — Z79899 Other long term (current) drug therapy: Secondary | ICD-10-CM | POA: Diagnosis not present

## 2019-02-04 DIAGNOSIS — Z91030 Bee allergy status: Secondary | ICD-10-CM | POA: Diagnosis not present

## 2019-02-04 HISTORY — PX: LEFT HEART CATH AND CORONARY ANGIOGRAPHY: CATH118249

## 2019-02-04 LAB — HEMOGLOBIN A1C
Hgb A1c MFr Bld: 5.3 % (ref 4.8–5.6)
Mean Plasma Glucose: 105.41 mg/dL

## 2019-02-04 LAB — HEPARIN LEVEL (UNFRACTIONATED)
Heparin Unfractionated: 0.34 IU/mL (ref 0.30–0.70)
Heparin Unfractionated: 0.34 IU/mL (ref 0.30–0.70)

## 2019-02-04 LAB — CBC
HCT: 35.3 % — ABNORMAL LOW (ref 36.0–46.0)
Hemoglobin: 10.8 g/dL — ABNORMAL LOW (ref 12.0–15.0)
MCH: 26.4 pg (ref 26.0–34.0)
MCHC: 30.6 g/dL (ref 30.0–36.0)
MCV: 86.3 fL (ref 80.0–100.0)
Platelets: 300 10*3/uL (ref 150–400)
RBC: 4.09 MIL/uL (ref 3.87–5.11)
RDW: 13.8 % (ref 11.5–15.5)
WBC: 10.4 10*3/uL (ref 4.0–10.5)
nRBC: 0 % (ref 0.0–0.2)

## 2019-02-04 LAB — BASIC METABOLIC PANEL
Anion gap: 8 (ref 5–15)
BUN: 12 mg/dL (ref 6–20)
CO2: 24 mmol/L (ref 22–32)
Calcium: 8.6 mg/dL — ABNORMAL LOW (ref 8.9–10.3)
Chloride: 106 mmol/L (ref 98–111)
Creatinine, Ser: 0.74 mg/dL (ref 0.44–1.00)
GFR calc Af Amer: >60 mL/min (ref >60)
GFR calc non Af Amer: >60 mL/min (ref >60)
Glucose, Bld: 97 mg/dL (ref 70–99)
Potassium: 3.4 mmol/L — ABNORMAL LOW (ref 3.5–5.1)
Sodium: 138 mmol/L (ref 135–145)

## 2019-02-04 SURGERY — LEFT HEART CATH AND CORONARY ANGIOGRAPHY
Anesthesia: Moderate Sedation

## 2019-02-04 MED ORDER — SODIUM CHLORIDE 0.9 % WEIGHT BASED INFUSION
1.0000 mL/kg/h | INTRAVENOUS | Status: AC
  Administered 2019-02-04: 1 mL/kg/h via INTRAVENOUS

## 2019-02-04 MED ORDER — HYDRALAZINE HCL 20 MG/ML IJ SOLN: 10.0000 mg | INTRAMUSCULAR | Status: AC | PRN

## 2019-02-04 MED ORDER — PANTOPRAZOLE SODIUM 40 MG PO TBEC
40.0000 mg | DELAYED_RELEASE_TABLET | Freq: Every day | ORAL | Status: DC
  Administered 2019-02-04: 40 mg via ORAL
  Filled 2019-02-04: qty 1

## 2019-02-04 MED ORDER — IOHEXOL 300 MG/ML  SOLN
INTRAMUSCULAR | Status: DC | PRN
  Administered 2019-02-04: 90 mL

## 2019-02-04 MED ORDER — LOSARTAN POTASSIUM 25 MG PO TABS
25.0000 mg | ORAL_TABLET | Freq: Every day | ORAL | Status: DC
  Administered 2019-02-04 – 2019-02-05 (×2): 25 mg via ORAL
  Filled 2019-02-04 (×2): qty 1

## 2019-02-04 MED ORDER — ACETAMINOPHEN 325 MG PO TABS: 650.0000 mg | ORAL_TABLET | ORAL | Status: DC | PRN

## 2019-02-04 MED ORDER — ASPIRIN 81 MG PO CHEW: 81.0000 mg | CHEWABLE_TABLET | ORAL | Status: DC

## 2019-02-04 MED ORDER — SODIUM CHLORIDE 0.9% FLUSH: 3.0000 mL | INTRAVENOUS | Status: DC | PRN

## 2019-02-04 MED ORDER — FENTANYL CITRATE (PF) 100 MCG/2ML IJ SOLN
INTRAMUSCULAR | Status: DC | PRN
  Administered 2019-02-04: 50 ug via INTRAVENOUS

## 2019-02-04 MED ORDER — LABETALOL HCL 5 MG/ML IV SOLN: 10.0000 mg | INTRAVENOUS | Status: AC | PRN

## 2019-02-04 MED ORDER — SCOPOLAMINE 1 MG/3DAYS TD PT72
1.0000 | MEDICATED_PATCH | TRANSDERMAL | Status: DC
  Administered 2019-02-04: 1.5 mg via TRANSDERMAL
  Filled 2019-02-04: qty 1

## 2019-02-04 MED ORDER — MIDAZOLAM HCL 2 MG/2ML IJ SOLN
INTRAMUSCULAR | Status: DC | PRN
  Administered 2019-02-04: 1 mg via INTRAVENOUS

## 2019-02-04 MED ORDER — MECLIZINE HCL 25 MG PO TABS
25.0000 mg | ORAL_TABLET | Freq: Two times a day (BID) | ORAL | Status: DC | PRN
  Administered 2019-02-04: 25 mg via ORAL
  Filled 2019-02-04 (×2): qty 1

## 2019-02-04 MED ORDER — TOPIRAMATE 25 MG PO TABS
75.0000 mg | ORAL_TABLET | Freq: Every day | ORAL | Status: DC
  Administered 2019-02-04: 75 mg via ORAL
  Filled 2019-02-04 (×2): qty 3

## 2019-02-04 MED ORDER — ONDANSETRON HCL 4 MG/2ML IJ SOLN: 4.0000 mg | Freq: Four times a day (QID) | INTRAMUSCULAR | Status: DC | PRN

## 2019-02-04 MED ORDER — SODIUM CHLORIDE 0.9% FLUSH
3.0000 mL | Freq: Two times a day (BID) | INTRAVENOUS | Status: DC
  Administered 2019-02-04 – 2019-02-05 (×3): 3 mL via INTRAVENOUS

## 2019-02-04 MED ORDER — HEPARIN (PORCINE) IN NACL 1000-0.9 UT/500ML-% IV SOLN
INTRAVENOUS | Status: DC | PRN
  Administered 2019-02-04: 500 mL

## 2019-02-04 MED ORDER — ONDANSETRON HCL 4 MG/2ML IJ SOLN
INTRAMUSCULAR | Status: AC
  Filled 2019-02-04: qty 2

## 2019-02-04 MED ORDER — POTASSIUM CHLORIDE CRYS ER 20 MEQ PO TBCR
20.0000 meq | EXTENDED_RELEASE_TABLET | Freq: Once | ORAL | Status: AC
  Administered 2019-02-04: 20 meq via ORAL
  Filled 2019-02-04: qty 1

## 2019-02-04 MED ORDER — FENTANYL CITRATE (PF) 100 MCG/2ML IJ SOLN
INTRAMUSCULAR | Status: AC
  Filled 2019-02-04: qty 2

## 2019-02-04 MED ORDER — SODIUM CHLORIDE 0.9 % IV SOLN: 250.0000 mL | INTRAVENOUS | Status: DC | PRN

## 2019-02-04 MED ORDER — SODIUM CHLORIDE 0.9 % WEIGHT BASED INFUSION: 1.0000 mL/kg/h | INTRAVENOUS | Status: DC

## 2019-02-04 MED ORDER — MIDAZOLAM HCL 2 MG/2ML IJ SOLN
INTRAMUSCULAR | Status: AC
  Filled 2019-02-04: qty 2

## 2019-02-04 MED ORDER — ONDANSETRON HCL 4 MG/2ML IJ SOLN
INTRAMUSCULAR | Status: DC | PRN
  Administered 2019-02-04: 4 mg via INTRAVENOUS

## 2019-02-04 MED ORDER — HEPARIN (PORCINE) IN NACL 1000-0.9 UT/500ML-% IV SOLN
INTRAVENOUS | Status: AC
  Filled 2019-02-04: qty 1000

## 2019-02-04 MED ORDER — SODIUM CHLORIDE 0.9 % WEIGHT BASED INFUSION
3.0000 mL/kg/h | INTRAVENOUS | Status: DC
  Administered 2019-02-04: 3 mL/kg/h via INTRAVENOUS

## 2019-02-04 SURGICAL SUPPLY — 9 items
CATH INFINITI 5FR ANG PIGTAIL (CATHETERS) ×3 IMPLANT
CATH INFINITI 5FR JL4 (CATHETERS) ×3 IMPLANT
CATH INFINITI JR4 5F (CATHETERS) ×3 IMPLANT
DEVICE CLOSURE MYNXGRIP 5F (Vascular Products) ×3 IMPLANT
KIT MANI 3VAL PERCEP (MISCELLANEOUS) ×3 IMPLANT
NEEDLE PERC 18GX7CM (NEEDLE) ×3 IMPLANT
PACK CARDIAC CATH (CUSTOM PROCEDURE TRAY) ×3 IMPLANT
SHEATH AVANTI 5FR X 11CM (SHEATH) ×3 IMPLANT
WIRE GUIDERIGHT .035X150 (WIRE) ×6 IMPLANT

## 2019-02-04 NOTE — Progress Notes (Signed)
Report called to special nurse Claiborne Billings

## 2019-02-04 NOTE — Progress Notes (Signed)
[facility] Cardiology [facility] Encounter Note  Patient: Carly Norton / Admit Date: 02/02/2019 / Date of Encounter: 02/04/2019, 12:30 PM   Subjective: Patient doing better throughout this afternoon with no evidence of chest discomfort.  Patient is somewhat weak and fatigued with headache.  Troponin with a minimal peak more consistent with demand ischemia likely from severe malignant hypertension episode rather than acute coronary syndrome Cardiac catheterization showing normal LV systolic function with ejection fraction of 60% Normal coronary arteries without evidence of atherosclerosis  Review of Systems: Positive for: Headache Negative for: Vision change, hearing change, syncope, dizziness, nausea, vomiting,diarrhea, bloody stool, stomach pain, cough, congestion, diaphoresis, urinary frequency, urinary pain,skin lesions, skin rashes Others previously listed  Objective: Telemetry: Normal sinus rhythm Physical Exam: Blood pressure (!) 147/80, pulse 62, temperature 98.3 F (36.8 C), temperature source Oral, resp. rate 16, height 5\' 5"  (1.651 m), weight 106.9 kg, SpO2 100 %. Body mass index is 39.22 kg/m. General: Well developed, well nourished, in no acute distress. Head: Normocephalic, atraumatic, sclera non-icteric, no xanthomas, nares are without discharge. Neck: No apparent masses Lungs: Normal respirations with no wheezes, no rhonchi, no rales , no crackles   Heart: Regular rate and rhythm, normal S1 S2, no murmur, no rub, no gallop, PMI is normal size and placement, carotid upstroke normal without bruit, jugular venous pressure normal Abdomen: Soft, non-tender, non-distended with normoactive bowel sounds. No hepatosplenomegaly. Abdominal aorta is normal size without bruit Extremities: No edema, no clubbing, no cyanosis, no ulcers,  Peripheral: 2+ radial, 2+ femoral, 2+ dorsal pedal pulses Neuro: Alert and oriented. Moves all extremities spontaneously. Psych:  Responds to  questions appropriately with a normal affect.   Intake/Output Summary (Last 24 hours) at 02/04/2019 1230 Last data filed at 02/04/2019 0831 Gross per 24 hour  Intake 287.93 ml  Output 700 ml  Net -412.07 ml    Inpatient Medications:  . [START ON 02/05/2019] aspirin  81 mg Oral Pre-Cath  . [MAR Hold] aspirin EC  81 mg Oral Daily  . [MAR Hold] atorvastatin  40 mg Oral q1800  . [MAR Hold] busPIRone  10 mg Oral Daily  . [MAR Hold] carvedilol  3.125 mg Oral BID WC  . [MAR Hold] FLUoxetine  40 mg Oral Daily  . [MAR Hold] levothyroxine  125 mcg Oral Q0600  . [MAR Hold] pantoprazole  40 mg Oral Daily  . [MAR Hold] sodium chloride flush  3 mL Intravenous Q12H  . [MAR Hold] topiramate  75 mg Oral Daily   Infusions:  . sodium chloride    . [START ON 02/05/2019] sodium chloride 3 mL/kg/hr (02/04/19 1153)   Followed by  . [START ON 02/05/2019] sodium chloride    . heparin 1,450 Units/hr (02/03/19 2129)    Labs: Recent Labs    02/02/19 2248 02/04/19 0333  NA 140 138  K 3.6 3.4*  CL 106 106  CO2 25 24  GLUCOSE 128* 97  BUN 11 12  CREATININE 0.82 0.74  CALCIUM 8.9 8.6*   No results for input(s): AST, ALT, ALKPHOS, BILITOT, PROT, ALBUMIN in the last 72 hours. Recent Labs    02/02/19 2248 02/04/19 0333  WBC 9.7 10.4  HGB 11.5* 10.8*  HCT 35.8* 35.3*  MCV 83.1 86.3  PLT 290 300   No results for input(s): CKTOTAL, CKMB, TROPONINI in the last 72 hours. Invalid input(s): POCBNP No results for input(s): HGBA1C in the last 72 hours.   Weights: Filed Weights   02/02/19 2239 02/04/19 0338 02/04/19 1136  Weight: 99.8 kg 106.9 kg 106.9 kg     Radiology/Studies:  DG Chest 2 View  Result Date: 02/02/2019 CLINICAL DATA:  Hypertension, shortness of breath for 1 day EXAM: CHEST - 2 VIEW COMPARISON:  Radiograph 05/07/2006 FINDINGS: No consolidation, features of edema, pneumothorax, or effusion. Pulmonary vascularity is normally distributed. The cardiomediastinal contours are  unremarkable. No acute osseous or soft tissue abnormality. Degenerative changes are present in the imaged spine and shoulders. Cholecystectomy clips in the right upper quadrant IMPRESSION: No acute cardiopulmonary abnormality. Electronically Signed   By: Lovena Le M.D.   On: 02/02/2019 23:27     Assessment and Recommendation  48 y.o. female with hypertension hyperlipidemia and an episode of malignant hypertension causing chest discomfort but no EKG changes and minimal elevation of troponin consistent with demand ischemia rather than acute coronary syndrome now improved with a cardiac catheterization showing normal coronary arteries 1.  Continue hypertension control using carvedilol and additional angiotensin receptor blocker for goal systolic blood pressure below 140 mm 2.  Continue ambulation follow-up for improvements of symptoms and okay for discharge to home with follow-up for further adjustments of medication management for better goal of hypertension control 3.  No further cardiac diagnostics necessary at this time  Signed, Serafina Royals M.D. FACC

## 2019-02-04 NOTE — Progress Notes (Signed)
PROGRESS NOTE    Carly Norton  AST:[id number] DOB: [birth year] DOA: 02/02/2019 PCP: Rica Records, PA-C      Assessment & Plan:   Principal Problem:   NSTEMI (non-ST elevated myocardial infarction) [facility]) Active Problems:   Hypothyroidism   Hypertensive urgency   Anxiety  Demand ischemia: patient with family history of premature coronary artery disease in first-degree relative. Troponins are elevated. Continue on aspirin, statin, carvedilol. IV heparin drip d/c. S/p cardiac cath today which showed normal coronary arteries. Zofran, scopolamine patch prn for nausea & vomiting. Continue on tele. Cardio following and recs apprec   Hypertensive urgency: may have undiagnosed hypertension. Continue on carvedilol and will start losartan as per cardio. IV hydralazine prn   Hypokalemia: KCl repleated. Will continue to monitor   Hyperglycemia: no hx of DM. HbA1c 5.3.    Obesity: BMI 36.6. Would benefit from weight loss   Hypothyroidism: will continue on home dose of levothyroxine  Depression: severity unknown. Will continue on home dose of fluoxetine, buspirone  DVT prophylaxis: lovenox Code Status: full  Family Communication: discussed pt's care w/ pt's husband, Windy Fast, who was at pt's bedside and answered all of her questions Disposition Plan:   Consultants:   cardio   Procedures: cardiac cath: normal coronary arteries    Antimicrobials: n/a   Subjective: Pt c/o nausea still.   Objective: Vitals:   02/03/19 1518 02/03/19 1926 02/04/19 0338 02/04/19 0607  BP: (!) 150/83 (!) 149/82 136/78 130/85  Pulse: 65 61 65 60  Resp: 19   20  Temp: 98.4 F (36.9 C) 97.9 F (36.6 C) 97.9 F (36.6 C) 97.7 F (36.5 C)  TempSrc:  Oral Oral Oral  SpO2: 98% 100% 98% 98%  Weight:   106.9 kg   Height:        Intake/Output Summary (Last 24 hours) at 02/04/2019 0759 Last data filed at 02/04/2019 0700 Gross per 24 hour  Intake 368.3 ml  Output 1500 ml  Net -1131.7 ml    Filed Weights   02/02/19 2239 02/04/19 0338  Weight: 99.8 kg 106.9 kg    Examination:  General exam: Appears calm but uncomfortable  Respiratory system: Clear to auscultation. No wheezes, rales or rhonchi. Respiratory effort normal. Cardiovascular system: S1 & S2 normal.  No rubs, gallops or clicks.  Gastrointestinal system: Abdomen is obese, soft and nontender. Normal bowel sounds heard. Central nervous system: Alert and oriented. Moves all 4 extremities Psychiatry: Judgement and insight appear normal. Flat mood and affect     Data Reviewed: I have personally reviewed following labs and imaging studies  CBC: Recent Labs  Lab 02/02/19 2248 02/04/19 0333  WBC 9.7 10.4  HGB 11.5* 10.8*  HCT 35.8* 35.3*  MCV 83.1 86.3  PLT 290 300   Basic Metabolic Panel: Recent Labs  Lab 02/02/19 2248 02/04/19 0333  NA 140 138  K 3.6 3.4*  CL 106 106  CO2 25 24  GLUCOSE 128* 97  BUN 11 12  CREATININE 0.82 0.74  CALCIUM 8.9 8.6*   GFR: Estimated Creatinine Clearance: 103.4 mL/min (by C-G formula based on SCr of 0.74 mg/dL). Liver Function Tests: No results for input(s): AST, ALT, ALKPHOS, BILITOT, PROT, ALBUMIN in the last 168 hours. No results for input(s): LIPASE, AMYLASE in the last 168 hours. No results for input(s): AMMONIA in the last 168 hours. Coagulation Profile: Recent Labs  Lab 02/03/19 0722  INR 1.0   Cardiac Enzymes: No results for input(s): CKTOTAL, CKMB, CKMBINDEX, TROPONINI in the last  168 hours. BNP (last 3 results) No results for input(s): PROBNP in the last 8760 hours. HbA1C: No results for input(s): HGBA1C in the last 72 hours. CBG: No results for input(s): GLUCAP in the last 168 hours. Lipid Profile: Recent Labs    02/03/19 0722  CHOL 229*  HDL 48  LDLCALC 102*  TRIG 395*  CHOLHDL 4.8   Thyroid Function Tests: Recent Labs    02/03/19 0722  TSH 7.152*   Anemia Panel: No results for input(s): VITAMINB12, FOLATE, FERRITIN, TIBC, IRON,  RETICCTPCT in the last 72 hours. Sepsis Labs: No results for input(s): PROCALCITON, LATICACIDVEN in the last 168 hours.  Recent Results (from the past 240 hour(s))  SARS CORONAVIRUS 2 (TAT 6-24 HRS) Nasopharyngeal Nasopharyngeal Swab     Status: None   Collection Time: 02/02/19 11:56 PM   Specimen: Nasopharyngeal Swab  Result Value Ref Range Status   SARS Coronavirus 2 NEGATIVE NEGATIVE Final    Comment: (NOTE) SARS-CoV-2 target nucleic acids are NOT DETECTED. The SARS-CoV-2 RNA is generally detectable in upper and lower respiratory specimens during the acute phase of infection. Negative results do not preclude SARS-CoV-2 infection, do not rule out co-infections with other pathogens, and should not be used as the sole basis for treatment or other patient management decisions. Negative results must be combined with clinical observations, patient history, and epidemiological information. The expected result is Negative. Fact Sheet for Patients: SugarRoll.be Fact Sheet for Healthcare Providers: https://www.woods-mathews.com/ This test is not yet approved or cleared by the [place] FDA and  has been authorized for detection and/or diagnosis of SARS-CoV-2 by FDA under an Emergency Use Authorization (EUA). This EUA will remain  in effect (meaning this test can be used) for the duration of the COVID-19 declaration under Section 56 4(b)(1) of the Act, 21 U.S.C. section 360bbb-3(b)(1), unless the authorization is terminated or revoked sooner. Performed at [facility], [place] [address] [place], [place] [address]          Radiology Studies: DG Chest 2 View  Result Date: 02/02/2019 CLINICAL DATA:  Hypertension, shortness of breath for 1 day EXAM: CHEST - 2 VIEW COMPARISON:  Radiograph 05/07/2006 FINDINGS: No consolidation, features of edema, pneumothorax, or effusion. Pulmonary vascularity is normally distributed. The  cardiomediastinal contours are unremarkable. No acute osseous or soft tissue abnormality. Degenerative changes are present in the imaged spine and shoulders. Cholecystectomy clips in the right upper quadrant IMPRESSION: No acute cardiopulmonary abnormality. Electronically Signed   By: Lovena Le M.D.   On: 02/02/2019 23:27        Scheduled Meds: . aspirin EC  81 mg Oral Daily  . atorvastatin  40 mg Oral q1800  . busPIRone  10 mg Oral Daily  . carvedilol  3.125 mg Oral BID WC  . FLUoxetine  40 mg Oral Daily  . levothyroxine  125 mcg Oral Q0600  . sodium chloride flush  3 mL Intravenous Q12H   Continuous Infusions: . heparin 1,450 Units/hr (02/03/19 2129)     LOS: 0 days    Time spent: 33 mins    Wyvonnia Dusky, MD [facility] Pager [phone]  If 7PM-7AM, please contact night-coverage [email or web address] Password [email or web address] 02/04/2019, 7:59 AM

## 2019-02-04 NOTE — Progress Notes (Signed)
[facility] for Heparin Indication: chest pain/ACS  Allergies  Allergen Reactions  . Bee Venom Anaphylaxis  . Fluconazole Rash and Swelling  . Lidocaine     unknown  . Latex Swelling and Rash    Patient Measurements: Height: 5' 5"  (165.1 cm) Weight: 235 lb 11.2 oz (106.9 kg) IBW/kg (Calculated) : 57 HEPARIN DW (KG): 79.8  Vital Signs: Temp: 97.9 F (36.6 C) (12/29 0338) Temp Source: Oral (12/29 0338) BP: 136/78 (12/29 0338) Pulse Rate: 65 (12/29 0338)  Labs: Recent Labs    02/02/19 2248 02/03/19 0722 02/03/19 1527 02/03/19 2016 02/04/19 0333  HGB 11.5*  --   --   --  10.8*  HCT 35.8*  --   --   --  35.3*  PLT 290  --   --   --  300  APTT  --  46*  --   --   --   LABPROT  --  13.1  --   --   --   INR  --  1.0  --   --   --   HEPARINUNFRC  --  0.20* 0.10* 0.12* 0.34  CREATININE 0.82  --   --   --   --   TROPONINIHS 185* 117*  --   --   --    Estimated Creatinine Clearance: 100.9 mL/min (by C-G formula based on SCr of 0.82 mg/dL).  Medical History: Past Medical History:  Diagnosis Date  . Anxiety and depression   . BRCA negative 05/2018   MyRisk neg except ATM and MSH2 VUS  . Common migraine   . Family history of breast cancer   . Family history of ovarian cancer   . H/O benign breast biopsy   . Hypothyroidism   . Increased risk of breast cancer 05/2018   IBIS=22.5%/riskscore=16.2%  . LGSIL on Pap smear of cervix 1998  . Menorrhagia   . Vertigo     Medications:  Medications Prior to Admission  Medication Sig Dispense Refill Last Dose  . busPIRone (BUSPAR) 10 MG tablet Take by mouth daily.    02/02/2019 at 1630  . eletriptan (RELPAX) 40 MG tablet Take 1 tablet po as a single dose.  May repeat dose once in two hours 12 tablet 2 prn at prn  . EPINEPHrine 0.3 mg/0.3 mL IJ SOAJ injection Inject 0.3 mLs (0.3 mg total) into the muscle as needed for anaphylaxis. 1 each 0 prn at prn  . FLUoxetine (PROZAC) 40 MG capsule  Take 40 mg by mouth daily.    02/02/2019 at 1630  . levothyroxine (SYNTHROID) 125 MCG tablet Take 125 mcg by mouth daily.   02/02/2019 at 1630  . meclizine (ANTIVERT) 25 MG tablet TAKE 1 TABLET (25 MG TOTAL) BY MOUTH 2 (TWO) TIMES DAILY AS NEEDED. 30 tablet 0 Past Week at Unknown time  . pantoprazole (PROTONIX) 40 MG tablet Take 1 tablet (40 mg total) by mouth daily. 90 tablet 0 02/02/2019 at 1630  . topiramate (TOPAMAX) 25 MG tablet TAKE UP TO 4 TABLETS A DAY 120 tablet 3 02/02/2019 at 1630  . Multiple Vitamin (DAILY VITAMINS) tablet Take 1 tablet by mouth daily.       Assessment: Pharmacy asked to initiate and monitor Heparin for ACS.  Baseline labs ordered.  No anticoagulants PTA per current med list.   12/28  @0722  HL 0.20. Subtherapeutic. Confirmed with ED nurse no heparin interruptions or issues in the the line.    12/28: HL @  1527 = 0.1.   Heparin gtt was stopped for 1 hr .   Will recheck HL 6 hrs after drip restart on 12/28 @ 2000.  12/28:  HL @ 2016 = 0.12    Goal of Therapy:  Heparin level 0.3-0.7 units/ml Monitor platelets by anticoagulation protocol: Yes   Plan:  12/29 @ 0330 HL 0.34 therapeutic. Will continue current rate and will recheck HL at 0900.  Tobie Lords, PharmD, BCPS Clinical Pharmacist 02/04/2019,4:41 AM

## 2019-02-04 NOTE — Progress Notes (Signed)
ANTICOAGULATION CONSULT NOTE   Pharmacy Consult for Heparin Indication: chest pain/ACS  Allergies  Allergen Reactions  . Bee Venom Anaphylaxis  . Fluconazole Rash and Swelling  . Lidocaine     unknown  . Latex Swelling and Rash    Patient Measurements: Height: 5' 5" (165.1 cm) Weight: 235 lb 11.2 oz (106.9 kg) IBW/kg (Calculated) : 57 HEPARIN DW (KG): 79.8  Vital Signs: Temp: 97.8 F (36.6 C) (12/29 0831) Temp Source: Oral (12/29 0831) BP: 133/77 (12/29 0831) Pulse Rate: 60 (12/29 0831)  Labs: Recent Labs    02/02/19 2248 02/03/19 0722 02/03/19 2016 02/04/19 0333 02/04/19 0904  HGB 11.5*  --   --  10.8*  --   HCT 35.8*  --   --  35.3*  --   PLT 290  --   --  300  --   APTT  --  46*  --   --   --   LABPROT  --  13.1  --   --   --   INR  --  1.0  --   --   --   HEPARINUNFRC  --  0.20* 0.12* 0.34 0.34  CREATININE 0.82  --   --  0.74  --   TROPONINIHS 185* 117*  --   --   --    Estimated Creatinine Clearance: 103.4 mL/min (by C-G formula based on SCr of 0.74 mg/dL).  Medical History: Past Medical History:  Diagnosis Date  . Anxiety and depression   . BRCA negative 05/2018   MyRisk neg except ATM and MSH2 VUS  . Common migraine   . Family history of breast cancer   . Family history of ovarian cancer   . H/O benign breast biopsy   . Hypothyroidism   . Increased risk of breast cancer 05/2018   IBIS=22.5%/riskscore=16.2%  . LGSIL on Pap smear of cervix 1998  . Menorrhagia   . Vertigo     Medications:  Medications Prior to Admission  Medication Sig Dispense Refill Last Dose  . busPIRone (BUSPAR) 10 MG tablet Take by mouth daily.    02/02/2019 at 1630  . eletriptan (RELPAX) 40 MG tablet Take 1 tablet po as a single dose.  May repeat dose once in two hours 12 tablet 2 prn at prn  . EPINEPHrine 0.3 mg/0.3 mL IJ SOAJ injection Inject 0.3 mLs (0.3 mg total) into the muscle as needed for anaphylaxis. 1 each 0 prn at prn  . FLUoxetine (PROZAC) 40 MG capsule  Take 40 mg by mouth daily.    02/02/2019 at 1630  . levothyroxine (SYNTHROID) 125 MCG tablet Take 125 mcg by mouth daily.   02/02/2019 at 1630  . meclizine (ANTIVERT) 25 MG tablet TAKE 1 TABLET (25 MG TOTAL) BY MOUTH 2 (TWO) TIMES DAILY AS NEEDED. 30 tablet 0 Past Week at Unknown time  . pantoprazole (PROTONIX) 40 MG tablet Take 1 tablet (40 mg total) by mouth daily. 90 tablet 0 02/02/2019 at 1630  . topiramate (TOPAMAX) 25 MG tablet TAKE UP TO 4 TABLETS A DAY 120 tablet 3 02/02/2019 at 1630  . Multiple Vitamin (DAILY VITAMINS) tablet Take 1 tablet by mouth daily.       Assessment: Pharmacy asked to initiate and monitor Heparin for ACS.  Baseline labs ordered.  No anticoagulants PTA per current med list.   12/28  @0722 HL 0.20. Subtherapeutic. Confirmed with ED nurse no heparin interruptions or issues in the the line.   12/28: HL @ 1527 =   0.1.   Heparin gtt was stopped for 1 hr .   Will recheck HL 6 hrs after drip restart on 12/28 @ 2000. 12/28:  HL @ 2016 = 0.12    12/29 @ 0330 HL 0.34 therapeutic. 12/29 @ 0904 HL 0.34 therapeutic x 2  Goal of Therapy:  Heparin level 0.3-0.7 units/ml Monitor platelets by anticoagulation protocol: Yes   Plan:  Will continue current rate 1450 units/hr and will recheck CBC/HL with am labs  Charles M Shanlever, PharmD, BCPS Clinical Pharmacist 02/04/2019 10:01 AM  

## 2019-02-04 NOTE — Consult Note (Signed)
[facility] Cardiology Consultation Note  Patient ID: Carly Norton, MRN: [id number], DOB/AGE: [birth year] 48 y.o. Admit date: 02/02/2019   Date of Consult: 02/04/2019 Primary Physician: Chad Cordial, PA-C Primary Cardiologist: None  Chief Complaint:  Chief Complaint  Patient presents with  . Hypertension   Reason for Consult: Elevated troponin  HPI: 48 y.o. female with known hypertension hyperlipidemia and family history of cardiovascular disease who has had some progressive weight gain shortness of breath with physical activity and some atypical chest discomfort occurring at night as well as with physical activity relieved by rest.  This is progressing over the last several months but had an episode while she was at work where her blood pressure significantly elevated and had difficulty.  She did get her blood pressure down when coming to the emergency room but had some nonspecific ST changes by EKG and an elevated troponin of 185.  With her family history hypertension and probable hyperlipidemia the patient needed further evaluation and treatment options.  She has been pain-free and without shortness of breath while she is at the hospital and doing much better at this time.  Past Medical History:  Diagnosis Date  . Anxiety and depression   . BRCA negative 05/2018   MyRisk neg except ATM and MSH2 VUS  . Common migraine   . Family history of breast cancer   . Family history of ovarian cancer   . H/O benign breast biopsy   . Hypothyroidism   . Increased risk of breast cancer 05/2018   IBIS=22.5%/riskscore=16.2%  . LGSIL on Pap smear of cervix 1998  . Menorrhagia   . Vertigo       Surgical History:  Past Surgical History:  Procedure Laterality Date  . BREAST BIOPSY Left 2014   benign  . BREAST BIOPSY Left 04/01/2015   path pending  . TUBAL LIGATION    . VAGINAL HYSTERECTOMY  2002   Dr. Rayford Halsted; due to menorrhagia/anemia     Home Meds: Prior to Admission  medications   Medication Sig Start Date End Date Taking? Authorizing Provider  busPIRone (BUSPAR) 10 MG tablet Take by mouth daily.  02/13/18  Yes [provider]  eletriptan (RELPAX) 40 MG tablet Take 1 tablet po as a single dose.  May repeat dose once in two hours 01/29/19  Yes Sable Feil, PA-C  EPINEPHrine 0.3 mg/0.3 mL IJ SOAJ injection Inject 0.3 mLs (0.3 mg total) into the muscle as needed for anaphylaxis. 12/04/18  Yes Betancourt, Aura Fey, NP  FLUoxetine (PROZAC) 40 MG capsule Take 40 mg by mouth daily.  02/14/18  Yes [provider]  levothyroxine (SYNTHROID) 125 MCG tablet Take 125 mcg by mouth daily. 10/12/18  Yes [provider]  meclizine (ANTIVERT) 25 MG tablet TAKE 1 TABLET (25 MG TOTAL) BY MOUTH 2 (TWO) TIMES DAILY AS NEEDED. 01/30/19  Yes Betancourt, Tina A, NP  pantoprazole (PROTONIX) 40 MG tablet Take 1 tablet (40 mg total) by mouth daily. 11/05/18  Yes Jan Fireman, PA-C  topiramate (TOPAMAX) 25 MG tablet TAKE UP TO 4 TABLETS A DAY 10/08/15  Yes Mar Daring, PA-C  Multiple Vitamin (DAILY VITAMINS) tablet Take 1 tablet by mouth daily.    [provider]    Inpatient Medications:  . aspirin EC  81 mg Oral Daily  . atorvastatin  40 mg Oral q1800  . busPIRone  10 mg Oral Daily  . carvedilol  3.125 mg Oral BID WC  . FLUoxetine  40  mg Oral Daily  . levothyroxine  125 mcg Oral Q0600  . sodium chloride flush  3 mL Intravenous Q12H   . heparin 1,450 Units/hr (02/03/19 2129)    Allergies:  Allergies  Allergen Reactions  . Bee Venom Anaphylaxis  . Fluconazole Rash and Swelling  . Lidocaine     unknown  . Latex Swelling and Rash    Social History   Socioeconomic History  . Marital status: Married    Spouse name: Not on file  . Number of children: Not on file  . Years of education: Not on file  . Highest education level: Not on file  Occupational History  . Not on file  Tobacco Use  . Smoking status: Never Smoker  .  Smokeless tobacco: Never Used  Substance and Sexual Activity  . Alcohol use: Not Currently  . Drug use: Not Currently  . Sexual activity: Yes    Birth control/protection: Surgical    Comment: Hysterectomy  Other Topics Concern  . Not on file  Social History Narrative  . Not on file   Social Determinants of Health   Financial Resource Strain:   . Difficulty of Paying Living Expenses: Not on file  Food Insecurity:   . Worried About Charity fundraiser in the Last Year: Not on file  . Ran Out of Food in the Last Year: Not on file  Transportation Needs:   . Lack of Transportation (Medical): Not on file  . Lack of Transportation (Non-Medical): Not on file  Physical Activity:   . Days of Exercise per Week: Not on file  . Minutes of Exercise per Session: Not on file  Stress:   . Feeling of Stress : Not on file  Social Connections:   . Frequency of Communication with Friends and Family: Not on file  . Frequency of Social Gatherings with Friends and Family: Not on file  . Attends Religious Services: Not on file  . Active Member of Clubs or Organizations: Not on file  . Attends Archivist Meetings: Not on file  . Marital Status: Not on file  Intimate Partner Violence:   . Fear of Current or Ex-Partner: Not on file  . Emotionally Abused: Not on file  . Physically Abused: Not on file  . Sexually Abused: Not on file     Family History  Problem Relation Age of Onset  . Breast cancer Paternal Aunt 53  . Hypothyroidism Mother   . Cancer Father   . Heart failure Father   . Depression Sister   . Ovarian cancer Maternal Grandmother 54  . Pancreatic cancer Maternal Grandfather 27  . Breast cancer Paternal Aunt 75  . Colon cancer Maternal Aunt 50     Review of Systems Positive for shortness of breath chest pain Negative for: General:  chills, fever, night sweats or weight changes.  Cardiovascular: PND orthopnea syncope dizziness  Dermatological skin lesions  rashes Respiratory: Cough congestion Urologic: Frequent urination urination at night and hematuria Abdominal: negative for nausea, vomiting, diarrhea, bright red blood per rectum, melena, or hematemesis Neurologic: negative for visual changes, and/or hearing changes  All other systems reviewed and are otherwise negative except as noted above.  Labs: No results for input(s): CKTOTAL, CKMB, TROPONINI in the last 72 hours. Lab Results  Component Value Date   WBC 10.4 02/04/2019   HGB 10.8 (L) 02/04/2019   HCT 35.3 (L) 02/04/2019   MCV 86.3 02/04/2019   PLT 300 02/04/2019    Recent Labs  Lab 02/04/19 0333  NA 138  K 3.4*  CL 106  CO2 24  BUN 12  CREATININE 0.74  CALCIUM 8.6*  GLUCOSE 97   Lab Results  Component Value Date   CHOL 229 (H) 02/03/2019   HDL 48 02/03/2019   LDLCALC 102 (H) 02/03/2019   TRIG 395 (H) 02/03/2019   No results found for: DDIMER  Radiology/Studies:  DG Chest 2 View  Result Date: 02/02/2019 CLINICAL DATA:  Hypertension, shortness of breath for 1 day EXAM: CHEST - 2 VIEW COMPARISON:  Radiograph 05/07/2006 FINDINGS: No consolidation, features of edema, pneumothorax, or effusion. Pulmonary vascularity is normally distributed. The cardiomediastinal contours are unremarkable. No acute osseous or soft tissue abnormality. Degenerative changes are present in the imaged spine and shoulders. Cholecystectomy clips in the right upper quadrant IMPRESSION: No acute cardiopulmonary abnormality. Electronically Signed   By: Lovena Le M.D.   On: 02/02/2019 23:27    EKG: Normal sinus rhythm with nonspecific ST changes  Weights: Filed Weights   02/02/19 2239 02/04/19 0338  Weight: 99.8 kg 106.9 kg     Physical Exam: Blood pressure 130/85, pulse 60, temperature 97.7 F (36.5 C), temperature source Oral, resp. rate 20, height 5' 5"  (1.651 m), weight 106.9 kg, SpO2 98 %. Body mass index is 39.22 kg/m. General: Well developed, well nourished, in no acute  distress. Head eyes ears nose throat: Normocephalic, atraumatic, sclera non-icteric, no xanthomas, nares are without discharge. No apparent thyromegaly and/or mass  Lungs: Normal respiratory effort.  no wheezes, no rales, no rhonchi.  Heart: RRR with normal S1 S2. no murmur gallop, no rub, PMI is normal size and placement, carotid upstroke normal without bruit, jugular venous pressure is normal Abdomen: Soft, non-tender, non-distended with normoactive bowel sounds. No hepatomegaly. No rebound/guarding. No obvious abdominal masses. Abdominal aorta is normal size without bruit Extremities: No edema. no cyanosis, no clubbing, no ulcers  Peripheral : 2+ bilateral upper extremity pulses, 2+ bilateral femoral pulses, 2+ bilateral dorsal pedal pulse Neuro: Alert and oriented. No facial asymmetry. No focal deficit. Moves all extremities spontaneously. Musculoskeletal: Normal muscle tone without kyphosis Psych:  Responds to questions appropriately with a normal affect.    Assessment: 48 year old female with hypertension hyperlipidemia family history of cardiovascular disease with malignant hypertension episode chest pain shortness of breath consistent with possible non-ST elevation myocardial infarction now hemodynamically stable  Plan: 1.  Continue heparin for further risk reduction of myocardial infarction 2.  Hypertension control with current medical regimen 3.  Proceed to cardiac catheterization to assess coronary anatomy and further treatment thereof is necessary.  Patient understands the risk and benefits of cardiac catheterization.  This includes the possibility of death stroke heart attack infection bleeding or blood clot.  She is at low risk for conscious sedation 4.  Further diagnostic testing and treatment options after above  Signed, Corey Skains M.D. [facility] 02/04/2019, 7:51 AM

## 2019-02-05 LAB — CBC
HCT: 35.3 % — ABNORMAL LOW (ref 36.0–46.0)
Hemoglobin: 11.3 g/dL — ABNORMAL LOW (ref 12.0–15.0)
MCH: 26.7 pg (ref 26.0–34.0)
MCHC: 32 g/dL (ref 30.0–36.0)
MCV: 83.5 fL (ref 80.0–100.0)
Platelets: 290 10*3/uL (ref 150–400)
RBC: 4.23 MIL/uL (ref 3.87–5.11)
RDW: 13.9 % (ref 11.5–15.5)
WBC: 11.2 10*3/uL — ABNORMAL HIGH (ref 4.0–10.5)
nRBC: 0 % (ref 0.0–0.2)

## 2019-02-05 LAB — BASIC METABOLIC PANEL
Anion gap: 8 (ref 5–15)
BUN: 10 mg/dL (ref 6–20)
CO2: 24 mmol/L (ref 22–32)
Calcium: 8.8 mg/dL — ABNORMAL LOW (ref 8.9–10.3)
Chloride: 106 mmol/L (ref 98–111)
Creatinine, Ser: 0.81 mg/dL (ref 0.44–1.00)
GFR calc Af Amer: >60 mL/min (ref >60)
GFR calc non Af Amer: >60 mL/min (ref >60)
Glucose, Bld: 96 mg/dL (ref 70–99)
Potassium: 4.1 mmol/L (ref 3.5–5.1)
Sodium: 138 mmol/L (ref 135–145)

## 2019-02-05 LAB — HEPARIN LEVEL (UNFRACTIONATED): Heparin Unfractionated: <0.1 IU/mL — ABNORMAL LOW (ref 0.30–0.70)

## 2019-02-05 MED ORDER — ASPIRIN 81 MG PO TBEC: 81.0000 mg | DELAYED_RELEASE_TABLET | Freq: Every day | ORAL | 0 refills | Status: AC

## 2019-02-05 MED ORDER — ENOXAPARIN SODIUM 40 MG/0.4ML ~~LOC~~ SOLN
40.0000 mg | SUBCUTANEOUS | Status: DC
  Administered 2019-02-05: 40 mg via SUBCUTANEOUS
  Filled 2019-02-05: qty 0.4

## 2019-02-05 MED ORDER — VERAPAMIL HCL ER 120 MG PO TBCR
120.0000 mg | EXTENDED_RELEASE_TABLET | Freq: Every day | ORAL | Status: DC
  Administered 2019-02-05: 120 mg via ORAL
  Filled 2019-02-05: qty 1

## 2019-02-05 MED ORDER — CARVEDILOL 3.125 MG PO TABS: 3.1250 mg | ORAL_TABLET | Freq: Two times a day (BID) | ORAL | 0 refills | Status: DC

## 2019-02-05 MED ORDER — ATORVASTATIN CALCIUM 40 MG PO TABS: 40.0000 mg | ORAL_TABLET | Freq: Every day | ORAL | 0 refills | Status: AC

## 2019-02-05 MED ORDER — VERAPAMIL HCL ER 120 MG PO TBCR: 120.0000 mg | EXTENDED_RELEASE_TABLET | Freq: Every day | ORAL | 0 refills | Status: DC

## 2019-02-05 MED ORDER — LOSARTAN POTASSIUM 25 MG PO TABS: 25.0000 mg | ORAL_TABLET | Freq: Every day | ORAL | 0 refills | Status: AC

## 2019-02-05 MED ORDER — ONDANSETRON HCL 4 MG PO TABS: 4.0000 mg | ORAL_TABLET | Freq: Three times a day (TID) | ORAL | 0 refills | Status: AC | PRN

## 2019-02-05 NOTE — Progress Notes (Signed)
[facility] Cardiology [facility] Encounter Note  Patient: Carly Norton / Admit Date: 02/02/2019 / Date of Encounter: 02/05/2019, 8:41 AM   Subjective: Patient doing better throughout this afternoon with no evidence of chest discomfort.  Patient is somewhat weak and fatigued with headache.  Troponin with a minimal peak more consistent with demand ischemia likely from severe malignant hypertension episode rather than acute coronary syndrome Cardiac catheterization showing normal LV systolic function with ejection fraction of 60% Normal coronary arteries without evidence of atherosclerosis Patient still has some episode of headache which may respond to medication management either verapamil and or propranolol Review of Systems: Positive for: Headache Negative for: Vision change, hearing change, syncope, dizziness, nausea, vomiting,diarrhea, bloody stool, stomach pain, cough, congestion, diaphoresis, urinary frequency, urinary pain,skin lesions, skin rashes Others previously listed  Objective: Telemetry: Normal sinus rhythm Physical Exam: Blood pressure 128/61, pulse (!) 53, temperature 98.2 F (36.8 C), temperature source Oral, resp. rate 18, height 5\' 5"  (1.651 m), weight 106.9 kg, SpO2 100 %. Body mass index is 39.22 kg/m. General: Well developed, well nourished, in no acute distress. Head: Normocephalic, atraumatic, sclera non-icteric, no xanthomas, nares are without discharge. Neck: No apparent masses Lungs: Normal respirations with no wheezes, no rhonchi, no rales , no crackles   Heart: Regular rate and rhythm, normal S1 S2, no murmur, no rub, no gallop, PMI is normal size and placement, carotid upstroke normal without bruit, jugular venous pressure normal Abdomen: Soft, non-tender, non-distended with normoactive bowel sounds. No hepatosplenomegaly. Abdominal aorta is normal size without bruit Extremities: No edema, no clubbing, no cyanosis, no ulcers,  Peripheral: 2+ radial, 2+  femoral, 2+ dorsal pedal pulses Neuro: Alert and oriented. Moves all extremities spontaneously. Psych:  Responds to questions appropriately with a normal affect.   Intake/Output Summary (Last 24 hours) at 02/05/2019 0841 Last data filed at 02/05/2019 0616 Gross per 24 hour  Intake 2129.78 ml  Output 950 ml  Net 1179.78 ml    Inpatient Medications:  . aspirin EC  81 mg Oral Daily  . atorvastatin  40 mg Oral q1800  . busPIRone  10 mg Oral Daily  . carvedilol  3.125 mg Oral BID WC  . enoxaparin (LOVENOX) injection  40 mg Subcutaneous Q24H  . FLUoxetine  40 mg Oral Daily  . levothyroxine  125 mcg Oral Q0600  . losartan  25 mg Oral Daily  . pantoprazole  40 mg Oral Daily  . scopolamine  1 patch Transdermal Q72H  . sodium chloride flush  3 mL Intravenous Q12H  . topiramate  75 mg Oral Daily  . verapamil  120 mg Oral Daily   Infusions:    Labs: Recent Labs    02/04/19 0333 02/05/19 0618  NA 138 138  K 3.4* 4.1  CL 106 106  CO2 24 24  GLUCOSE 97 96  BUN 12 10  CREATININE 0.74 0.81  CALCIUM 8.6* 8.8*   No results for input(s): AST, ALT, ALKPHOS, BILITOT, PROT, ALBUMIN in the last 72 hours. Recent Labs    02/04/19 0333 02/05/19 0618  WBC 10.4 11.2*  HGB 10.8* 11.3*  HCT 35.3* 35.3*  MCV 86.3 83.5  PLT 300 290   No results for input(s): CKTOTAL, CKMB, TROPONINI in the last 72 hours. Invalid input(s): POCBNP Recent Labs    02/04/19 0333  HGBA1C 5.3     Weights: Filed Weights   02/02/19 2239 02/04/19 0338 02/04/19 1136  Weight: 99.8 kg 106.9 kg 106.9 kg     Radiology/Studies:  DG Chest 2 View  Result Date: 02/02/2019 CLINICAL DATA:  Hypertension, shortness of breath for 1 day EXAM: CHEST - 2 VIEW COMPARISON:  Radiograph 05/07/2006 FINDINGS: No consolidation, features of edema, pneumothorax, or effusion. Pulmonary vascularity is normally distributed. The cardiomediastinal contours are unremarkable. No acute osseous or soft tissue abnormality. Degenerative  changes are present in the imaged spine and shoulders. Cholecystectomy clips in the right upper quadrant IMPRESSION: No acute cardiopulmonary abnormality. Electronically Signed   By: Lovena Le M.D.   On: 02/02/2019 23:27   CARDIAC CATHETERIZATION  Result Date: 02/04/2019 48 year old female with hypertension hyperlipidemia and episode of malignant hypertension with chest discomfort shortness of breath and minimal elevation of troponin consistent with demand ischemia rather than acute coronary syndrome LV angiogram showing normal LV systolic function with ejection fraction of 60% Normal coronary angiogram with 0% stenosis Plan Continue aggressive medication management for hypertension control including angiotensin receptor blocker and beta-blocker No further cardiac diagnostics necessary at this time Continue physical activity and ambulation and follow-up for any further significant symptoms and adjustments of medication as necessary    Assessment and Recommendation  48 y.o. female with hypertension hyperlipidemia and an episode of malignant hypertension causing chest discomfort but no EKG changes and minimal elevation of troponin consistent with demand ischemia rather than acute coronary syndrome now improved with a cardiac catheterization showing normal coronary arteries 1.  Continue hypertension control using carvedilol and additional angiotensin receptor blocker for goal systolic blood pressure below 140 mm 2.  Continue ambulation follow-up for improvements of symptoms and okay for discharge to home with follow-up for further adjustments of medication management for better goal of hypertension control 3.  No further cardiac diagnostics necessary at this time 4.  Possibly trial of verapamil 120 mg CD versus propranolol for chronic headache Signed, Serafina Royals M.D. FACC

## 2019-02-05 NOTE — Progress Notes (Signed)
Cardiovascular and Pulmonary Nurse Navigator Note:    48 year old with hypertension, hyperlipidemia, and an episode of malignant hypertension causing chest discomfort.  No EKG changes and minimal elevation of troponin consistent with demand ischemia rather than acute coronary syndrome.  Cardiac Cath performed which revealed normal coronary arteries.    Note:   Cardiac Rehab department received a referral for this patient for Cardiac Rehab with diagnosis of NSTEMI.   Documentation in cardiology note is indicating dx of demand ischemia and not ACS.  This RN reached out to Dr. Nehemiah Massed yesterday requesting cancellation of this referral, as RN is not able to cancel referral in the discharge navigator.  This RN closed the referral in the the departmental workqueue.   Today patient is for discharge.  This RN noted referral to Cardiac Referral is showing on patient's inpatient after visit summary.  This RN spoke with patient explaining that she is not a candidate for Cardiac Rehab due to clean coronary arteries on Cardiac Cath.  Explained to her the referral should have been cancelled.  Patient verbalized understanding.    Roanna Epley, RN, BSN, [facility]  Cardiovascular & Pulmonary Nurse Navigator  Direct Line: [phone]  Department Phone #: [phone] Fax: [phone]  Email Address: [email or web address]

## 2019-02-05 NOTE — Discharge Summary (Signed)
Physician Discharge Summary  Carly Norton OVF:[id number] DOB: November 23, 1970 DOA: 02/02/2019  PCP: Chad Cordial, PA-C  Admit date: 02/02/2019 Discharge date: 02/05/2019  Admitted From: home Disposition:  home  Recommendations for Outpatient Follow-up:  1. Follow up with PCP in 1-2 weeks 2. F/u cardio in 1-2 weeks  Home Health: no Equipment/Devices: n/a  Discharge Condition: stable CODE STATUS: full  Diet recommendation: Heart Healthy   Brief/Interim Summary: HPI taken from Dr. Damita Dunnings: Carly Norton is a 48 y.o. 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  She has no prior history of hypertension.  He denies chest pains, shortness of breath, nausea or vomiting.  She denies palpitations headache or blurred vision, cough or fever. ED Course: On arrival in the emergency room she was afebrile with blood pressure of 173/92 heart rate 86 oxygen saturation 100% on room air.  EKG showed nonspecific ST-T wave changes.  Troponin returned at 185.  Blood work was unremarkable.  She was treated with chewable aspirin topical nitroglycerin and started on a heparin infusion and hospitalist consulted for admission.  Hospital Course from Dr. Lenise Herald 12/29-12/30/20: Pt was taken to cardiac cath for possible NSTEMI. Cardiac cath showed normal coronaries. Of note, pt was started on carvedilol, losartan for uncontrolled HTN which is a new dx for the pt. Pt was also started on a statin for HLD. Pt will f/u w/ cardio as an outpatient. Also, pt was started on verapamil for migraine prophylaxis as per cardio.    Discharge Diagnoses:  Principal Problem:   NSTEMI (non-ST elevated  myocardial infarction) [facility]) Active Problems:   Hypothyroidism   Hypertensive urgency   Anxiety  Demand ischemia:patient with family history of premature coronary artery disease in first-degree relative. Troponins are elevated. Continue on aspirin, statin, carvedilol. IV heparin drip d/c. S/p cardiac cath 02/04/19 which showed normal coronary arteries. Zofran, scopolamine patch prn for nausea & vomiting. Continue on tele. Cardio following and recs apprec   Hypertensive urgency: may have undiagnosed hypertension. Continue on carvedilol and will start losartan as per cardio. IV hydralazine prn   Hypokalemia: WNL today. Will continue to monitor   Hyperglycemia: no hx of DM. HbA1c 5.3.    Obesity: BMI 36.6. Would benefit from weight loss   Hypothyroidism: will continue on home dose of levothyroxine  Depression: severity unknown. Will continue on home dose of fluoxetine, buspirone  Migraines:  started verapamil for migraine prophylaxis as per cardio   Discharge Instructions  Discharge Instructions    AMB Referral to Cardiac Rehabilitation - Phase II   Complete by: As directed    Diagnosis: NSTEMI   Diet - low sodium heart healthy   Complete by: As directed    Discharge instructions   Complete by: As directed    F/u PCP in 1-2 weeks; F/u cardio in 2 weeks   Increase activity slowly   Complete by: As directed      Allergies as of 02/05/2019      Reactions   Bee Venom Anaphylaxis   Fluconazole Rash, Swelling   Lidocaine Swelling   Pt states use of lidocaine causes swelling and carries epi pen for allergies   Latex Swelling, Rash  Medication List    TAKE these medications   aspirin 81 MG EC tablet Take 1 tablet (81 mg total) by mouth daily. Start taking on: February 06, 2019   atorvastatin 40 MG tablet Commonly known as: LIPITOR Take 1 tablet (40 mg total) by mouth daily at 6 PM.   busPIRone 10 MG tablet Commonly known as: BUSPAR Take by mouth daily.    carvedilol 3.125 MG tablet Commonly known as: COREG Take 1 tablet (3.125 mg total) by mouth 2 (two) times daily with a meal.   Daily Vitamins tablet Take 1 tablet by mouth daily.   eletriptan 40 MG tablet Commonly known as: RELPAX Take 1 tablet po as a single dose.  May repeat dose once in two hours   EPINEPHrine 0.3 mg/0.3 mL Soaj injection Commonly known as: EPI-PEN Inject 0.3 mLs (0.3 mg total) into the muscle as needed for anaphylaxis.   FLUoxetine 40 MG capsule Commonly known as: PROZAC Take 40 mg by mouth daily.   levothyroxine 125 MCG tablet Commonly known as: SYNTHROID Take 125 mcg by mouth daily.   losartan 25 MG tablet Commonly known as: COZAAR Take 1 tablet (25 mg total) by mouth daily. Start taking on: February 06, 2019   meclizine 25 MG tablet Commonly known as: ANTIVERT TAKE 1 TABLET (25 MG TOTAL) BY MOUTH 2 (TWO) TIMES DAILY AS NEEDED.   ondansetron 4 MG tablet Commonly known as: Zofran Take 1 tablet (4 mg total) by mouth every 8 (eight) hours as needed for up to 5 days for nausea or vomiting.   pantoprazole 40 MG tablet Commonly known as: PROTONIX Take 1 tablet (40 mg total) by mouth daily.   topiramate 25 MG tablet Commonly known as: TOPAMAX TAKE UP TO 4 TABLETS A DAY   verapamil 120 MG CR tablet Commonly known as: CALAN-SR Take 1 tablet (120 mg total) by mouth daily. Start taking on: February 06, 2019      Follow-up Information    Lamar BlinksKowalski, Bruce J, MD. Go on 02/19/2019.   Specialty: Cardiology Why: APPOINTMENT AT 9:30AM Contact information: [address] [facility] [place] [address] [phone]        Copland, Ilona Sorrellicia B, PA-C In 1 week.   Specialty: Obstetrics and Gynecology Contact information: [address] [place] [address] [phone]          Allergies  Allergen Reactions  . Bee Venom Anaphylaxis  . Fluconazole Rash and Swelling  . Lidocaine Swelling    Pt states use of  lidocaine causes swelling and carries epi pen for allergies  . Latex Swelling and Rash    Consultations:  Cardio, Dr. Gwen PoundsKowalski   Procedures/Studies: DG Chest 2 View  Result Date: 02/02/2019 CLINICAL DATA:  Hypertension, shortness of breath for 1 day EXAM: CHEST - 2 VIEW COMPARISON:  Radiograph 05/07/2006 FINDINGS: No consolidation, features of edema, pneumothorax, or effusion. Pulmonary vascularity is normally distributed. The cardiomediastinal contours are unremarkable. No acute osseous or soft tissue abnormality. Degenerative changes are present in the imaged spine and shoulders. Cholecystectomy clips in the right upper quadrant IMPRESSION: No acute cardiopulmonary abnormality. Electronically Signed   By: Kreg ShropshirePrice  DeHay M.D.   On: 02/02/2019 23:27   CARDIAC CATHETERIZATION  Result Date: 02/04/2019 48 year old female with hypertension hyperlipidemia and episode of malignant hypertension with chest discomfort shortness of breath and minimal elevation of troponin consistent with demand ischemia rather than acute coronary syndrome LV angiogram showing normal LV systolic function with ejection fraction of 60% Normal coronary  angiogram with 0% stenosis Plan Continue aggressive medication management for hypertension control including angiotensin receptor blocker and beta-blocker No further cardiac diagnostics necessary at this time Continue physical activity and ambulation and follow-up for any further significant symptoms and adjustments of medication as necessary    Subjective: Pt c/o fatigue  Discharge Exam: Vitals:   02/05/19 0801 02/05/19 0909  BP: 128/61   Pulse: (!) 53 (!) 56  Resp: 18   Temp: 98.2 F (36.8 C)   SpO2: 100%    Vitals:   02/04/19 1940 02/05/19 0612 02/05/19 0801 02/05/19 0909  BP: 137/76 127/69 128/61   Pulse: (!) 57 (!) 58 (!) 53 (!) 56  Resp:   18   Temp: 98.2 F (36.8 C) 98 F (36.7 C) 98.2 F (36.8 C)   TempSrc: Oral Oral Oral   SpO2: 98% 99% 100%    Weight:      Height:        General: Pt is alert, awake, not in acute distress Cardiovascular: S1/S2 +, no rubs, no gallops Respiratory: CTA bilaterally, no wheezing, no rhonchi Abdominal: Soft, NT, ND, bowel sounds + Extremities: no edema, no cyanosis    The results of significant diagnostics from this hospitalization (including imaging, microbiology, ancillary and laboratory) are listed below for reference.     Microbiology: Recent Results (from the past 240 hour(s))  SARS CORONAVIRUS 2 (TAT 6-24 HRS) Nasopharyngeal Nasopharyngeal Swab     Status: None   Collection Time: 02/02/19 11:56 PM   Specimen: Nasopharyngeal Swab  Result Value Ref Range Status   SARS Coronavirus 2 NEGATIVE NEGATIVE Final    Comment: (NOTE) SARS-CoV-2 target nucleic acids are NOT DETECTED. The SARS-CoV-2 RNA is generally detectable in upper and lower respiratory specimens during the acute phase of infection. Negative results do not preclude SARS-CoV-2 infection, do not rule out co-infections with other pathogens, and should not be used as the sole basis for treatment or other patient management decisions. Negative results must be combined with clinical observations, patient history, and epidemiological information. The expected result is Negative. Fact Sheet for Patients: HairSlick.no Fact Sheet for Healthcare Providers: quierodirigir.com This test is not yet approved or cleared by the [place] FDA and  has been authorized for detection and/or diagnosis of SARS-CoV-2 by FDA under an Emergency Use Authorization (EUA). This EUA will remain  in effect (meaning this test can be used) for the duration of the COVID-19 declaration under Section 56 4(b)(1) of the Act, 21 U.S.C. section 360bbb-3(b)(1), unless the authorization is terminated or revoked sooner. Performed at [facility], [address], [place], [place] [address]       Labs: BNP (last 3 results) No results for input(s): BNP in the last 8760 hours. Basic Metabolic Panel: Recent Labs  Lab 02/02/19 2248 02/04/19 0333 02/05/19 0618  NA 140 138 138  K 3.6 3.4* 4.1  CL 106 106 106  CO2 25 24 24   GLUCOSE 128* 97 96  BUN 11 12 10   CREATININE 0.82 0.74 0.81  CALCIUM 8.9 8.6* 8.8*   Liver Function Tests: No results for input(s): AST, ALT, ALKPHOS, BILITOT, PROT, ALBUMIN in the last 168 hours. No results for input(s): LIPASE, AMYLASE in the last 168 hours. No results for input(s): AMMONIA in the last 168 hours. CBC: Recent Labs  Lab 02/02/19 2248 02/04/19 0333 02/05/19 0618  WBC 9.7 10.4 11.2*  HGB 11.5* 10.8* 11.3*  HCT 35.8* 35.3* 35.3*  MCV 83.1 86.3 83.5  PLT 290 300 290  Cardiac Enzymes: No results for input(s): CKTOTAL, CKMB, CKMBINDEX, TROPONINI in the last 168 hours. BNP: Invalid input(s): POCBNP CBG: No results for input(s): GLUCAP in the last 168 hours. D-Dimer No results for input(s): DDIMER in the last 72 hours. Hgb A1c Recent Labs    02/04/19 0333  HGBA1C 5.3   Lipid Profile Recent Labs    02/03/19 0722  CHOL 229*  HDL 48  LDLCALC 102*  TRIG 395*  CHOLHDL 4.8   Thyroid function studies Recent Labs    02/03/19 0722  TSH 7.152*   Anemia work up No results for input(s): VITAMINB12, FOLATE, FERRITIN, TIBC, IRON, RETICCTPCT in the last 72 hours. Urinalysis    Component Value Date/Time   COLORURINE Yellow 07/06/2013 2111   APPEARANCEUR Clear 07/06/2013 2111   LABSPEC 1.011 07/06/2013 2111   PHURINE 5.0 07/06/2013 2111   GLUCOSEU Negative 07/06/2013 2111   HGBUR 2+ 07/06/2013 2111   BILIRUBINUR Negative 07/06/2013 2111   KETONESUR Negative 07/06/2013 2111   PROTEINUR Negative 07/06/2013 2111   NITRITE Negative 07/06/2013 2111   LEUKOCYTESUR Negative 07/06/2013 2111   Sepsis Labs Invalid input(s): PROCALCITONIN,  WBC,  LACTICIDVEN Microbiology Recent Results (from the past 240 hour(s))  SARS  CORONAVIRUS 2 (TAT 6-24 HRS) Nasopharyngeal Nasopharyngeal Swab     Status: None   Collection Time: 02/02/19 11:56 PM   Specimen: Nasopharyngeal Swab  Result Value Ref Range Status   SARS Coronavirus 2 NEGATIVE NEGATIVE Final    Comment: (NOTE) SARS-CoV-2 target nucleic acids are NOT DETECTED. The SARS-CoV-2 RNA is generally detectable in upper and lower respiratory specimens during the acute phase of infection. Negative results do not preclude SARS-CoV-2 infection, do not rule out co-infections with other pathogens, and should not be used as the sole basis for treatment or other patient management decisions. Negative results must be combined with clinical observations, patient history, and epidemiological information. The expected result is Negative. Fact Sheet for Patients: HairSlick.no Fact Sheet for Healthcare Providers: quierodirigir.com This test is not yet approved or cleared by the [place] FDA and  has been authorized for detection and/or diagnosis of SARS-CoV-2 by FDA under an Emergency Use Authorization (EUA). This EUA will remain  in effect (meaning this test can be used) for the duration of the COVID-19 declaration under Section 56 4(b)(1) of the Act, 21 U.S.C. section 360bbb-3(b)(1), unless the authorization is terminated or revoked sooner. Performed at [facility], [address] [place], [place] [address]      Time coordinating discharge: Over 30 minutes  SIGNED:   Charise Killian, MD  [facility] 02/05/2019, 1:32 PM Pager   If 7PM-7AM, please contact night-coverage [email or web address] Password [email or web address]

## 2019-02-06 ENCOUNTER — Other Ambulatory Visit: Payer: Self-pay

## 2019-02-06 ENCOUNTER — Ambulatory Visit: Payer: Self-pay | Admitting: Physician Assistant

## 2019-02-06 ENCOUNTER — Encounter: Payer: Self-pay | Admitting: Physician Assistant

## 2019-02-06 VITALS — BP 136/80 | HR 60 | Temp 98.2°F | Resp 18 | Ht 65.0 in | Wt 238.0 lb

## 2019-02-06 DIAGNOSIS — I214 Non-ST elevation (NSTEMI) myocardial infarction: Secondary | ICD-10-CM

## 2019-02-06 DIAGNOSIS — I10 Essential (primary) hypertension: Secondary | ICD-10-CM

## 2019-02-06 NOTE — Progress Notes (Signed)
States still feeling tired.  Requesting to have incision in right groin rechecked.  AMD

## 2019-02-06 NOTE — Progress Notes (Signed)
  Subjective:     Patient ID: Carly Norton, female   DOB: Dec 21, 1970, 48 y.o.   MRN: [id number]  HPI  48 yo F discharged yesterday from [facility] dx NSTEMI Card Cath 12/29 Dx demand ischemia rather than acute coronary syndrome EF 60% , normal coronary angiogram 0 % stenosis  Hypertensive episode pre admit 208/132 Since discharge yesterday reports doing well- No headache, SOB, CP, mildly fatigued, right inguinal tender   New start medications :Has taken all today ASA 81 mg QD Losartan 25 mg 1 QD Carvedilol 3.125 mg total by mouth two times daily with meal Verapamil 120 mg 1 daily  Review of Systems  All other systems reviewed and are negative.   Feels well ,moving well, drove herself to clinic-      Objective:   Physical Exam Vitals and nursing note reviewed.  Constitutional:      Appearance: Normal appearance.  Cardiovascular:     Rate and Rhythm: Normal rate and regular rhythm.     Pulses: Normal pulses.     Heart sounds: Normal heart sounds.  Pulmonary:     Effort: Pulmonary effort is normal.     Breath sounds: Normal breath sounds.  Abdominal:     General: Abdomen is flat.     Palpations: Abdomen is soft.     Comments: Right inguinal site bandaged occlusive intact No evidence of bleeding , no bruising visible Tenderness appropriate Left dressing in situ  Patient given supplies to have dressing changed after her shower tonight- Mother will assist her  Musculoskeletal:        General: Normal range of motion.  Skin:    General: Skin is warm and dry.  Neurological:     Mental Status: She is alert.  Psychiatric:        Behavior: Behavior normal.        Assessment:    VSS and clinical presentation are good.   Plan:      She is encouraged to rest and allow others to orchestrate the home. Discouraged from driving herself anywhere as she has new medications on board and has had a recent admission and cardiac evaluation.  Adequate hydration encouraged, diet as  tolerated, advance slowly Ambulate as comfortable; Rest and read. RTC [place] over weekend ( New Years Weekend) if concerns arise. Record BPs and call office on Monday with status   Plans meds graph checklist and daypill kit as adapting to new Rx and schedules. Supported and encouraged  OOW until Cardiology consult 02/19/2019

## 2019-02-19 DIAGNOSIS — I10 Essential (primary) hypertension: Secondary | ICD-10-CM | POA: Diagnosis not present

## 2019-02-19 DIAGNOSIS — E782 Mixed hyperlipidemia: Secondary | ICD-10-CM | POA: Diagnosis not present

## 2019-02-19 DIAGNOSIS — I248 Other forms of acute ischemic heart disease: Secondary | ICD-10-CM | POA: Diagnosis not present

## 2019-02-20 DIAGNOSIS — E782 Mixed hyperlipidemia: Secondary | ICD-10-CM | POA: Insufficient documentation

## 2019-02-20 DIAGNOSIS — I10 Essential (primary) hypertension: Secondary | ICD-10-CM | POA: Insufficient documentation

## 2019-03-07 ENCOUNTER — Telehealth: Payer: Self-pay

## 2019-03-07 ENCOUNTER — Other Ambulatory Visit: Payer: Self-pay | Admitting: Registered Nurse

## 2019-03-07 DIAGNOSIS — H8110 Benign paroxysmal vertigo, unspecified ear: Secondary | ICD-10-CM

## 2019-03-07 NOTE — Telephone Encounter (Signed)
Alera called requesting Rx refill for Meclizine.  States she took her last pill today.  Tried to call Harrell Gave, PA at [facility] Cardiology with Dr. Rozann Lesches at [phone] & office closed for the afternoon.  Called Karisha & advised her that we don't have a provider in the office on Friday.  Concerned if her Cardiology team wants it renewed & she states they know she's taking the medication.  Advised her if she needs medication over the weekend to contact [facility] Cardiology on-call for renewal.  Lexi said she will if she needs it & she'll follow-up with Cardiology on Monday.  AMD

## 2019-03-10 NOTE — Telephone Encounter (Signed)
Verapamil not on patient medication list from last cardiology appt dated 02/19/2019.  Patient to verify with cardiology if she is to continue this medication.  Meclizine 25mg  po BID prn #30 RF0 electronic Rx sent to her pharmacy of choice.  My chart message sent to patient to verify with cardiology if she is to continue verapamil.

## 2019-03-18 ENCOUNTER — Other Ambulatory Visit: Payer: Self-pay | Admitting: Internal Medicine

## 2019-03-18 DIAGNOSIS — E039 Hypothyroidism, unspecified: Secondary | ICD-10-CM | POA: Diagnosis not present

## 2019-03-18 DIAGNOSIS — D508 Other iron deficiency anemias: Secondary | ICD-10-CM | POA: Diagnosis not present

## 2019-03-18 DIAGNOSIS — R5383 Other fatigue: Secondary | ICD-10-CM | POA: Diagnosis not present

## 2019-03-18 DIAGNOSIS — Z1231 Encounter for screening mammogram for malignant neoplasm of breast: Secondary | ICD-10-CM | POA: Diagnosis not present

## 2019-03-18 DIAGNOSIS — Z0000 Encounter for general adult medical examination without abnormal findings: Secondary | ICD-10-CM | POA: Diagnosis not present

## 2019-03-18 DIAGNOSIS — Z1159 Encounter for screening for other viral diseases: Secondary | ICD-10-CM | POA: Diagnosis not present

## 2019-03-18 DIAGNOSIS — Z23 Encounter for immunization: Secondary | ICD-10-CM | POA: Diagnosis not present

## 2019-03-18 DIAGNOSIS — R42 Dizziness and giddiness: Secondary | ICD-10-CM | POA: Diagnosis not present

## 2019-03-18 DIAGNOSIS — Z9189 Other specified personal risk factors, not elsewhere classified: Secondary | ICD-10-CM | POA: Diagnosis not present

## 2019-03-28 ENCOUNTER — Telehealth: Payer: Self-pay | Admitting: Registered Nurse

## 2019-03-28 ENCOUNTER — Encounter: Payer: Self-pay | Admitting: Registered Nurse

## 2019-03-28 NOTE — Telephone Encounter (Signed)
Patient had not reviewed my chart message.  Please contact her via telephone.  She had asked for meclizine and verapamil Rx refills 07 Mar 2019.  Patient had appt 03/18/2019 with Dr Nemiah Commander annual physical.  Please verify with patient if Dr Nemiah Commander her new [facility] as she recommended continuing verapamil and cardiology follow up pending 06/17/2019 with Dr Gwen Pounds per Epic.  Marylene Land,  Your cardiologist did not list verapamil on your medication list for appt 02/19/2019. Please contact their office to ensure you are to continue this medication. I sent Rx for your meclizine refill to your pharmacy.  Sincerely,  Albina Billet NP-C

## 2019-04-01 NOTE — Telephone Encounter (Signed)
Contacted Jamel who verified Dr. Enid Baas is her new PCP.  She had a physical with her on 03/18/19 & received Rx refill for Meclizine.  Confirmed that she's taking Coreg, Losartan & Verapamil as instructed by Dr. Nemiah Commander.  She does need a refill for Verapamil & said she will contact Dr. Prudencio Pair office for the refill.  AMD

## 2019-04-01 NOTE — Telephone Encounter (Signed)
Noted new PCM Rx request to be forwarded to her new PCM.

## 2019-05-29 ENCOUNTER — Other Ambulatory Visit: Payer: Self-pay

## 2019-05-29 DIAGNOSIS — F419 Anxiety disorder, unspecified: Secondary | ICD-10-CM

## 2019-06-17 DIAGNOSIS — I10 Essential (primary) hypertension: Secondary | ICD-10-CM | POA: Diagnosis not present

## 2019-06-17 DIAGNOSIS — E782 Mixed hyperlipidemia: Secondary | ICD-10-CM | POA: Diagnosis not present

## 2019-06-24 DIAGNOSIS — M545 Low back pain: Secondary | ICD-10-CM | POA: Diagnosis not present

## 2019-06-24 DIAGNOSIS — E039 Hypothyroidism, unspecified: Secondary | ICD-10-CM | POA: Diagnosis not present

## 2019-06-24 DIAGNOSIS — M5440 Lumbago with sciatica, unspecified side: Secondary | ICD-10-CM | POA: Diagnosis not present

## 2019-08-05 ENCOUNTER — Other Ambulatory Visit: Payer: Self-pay | Admitting: Internal Medicine

## 2019-08-05 DIAGNOSIS — G43009 Migraine without aura, not intractable, without status migrainosus: Secondary | ICD-10-CM | POA: Diagnosis not present

## 2019-08-05 DIAGNOSIS — M5442 Lumbago with sciatica, left side: Secondary | ICD-10-CM

## 2019-08-05 DIAGNOSIS — M545 Low back pain: Secondary | ICD-10-CM | POA: Diagnosis not present

## 2019-08-05 DIAGNOSIS — I10 Essential (primary) hypertension: Secondary | ICD-10-CM | POA: Diagnosis not present

## 2019-08-05 DIAGNOSIS — E039 Hypothyroidism, unspecified: Secondary | ICD-10-CM | POA: Diagnosis not present

## 2019-08-10 ENCOUNTER — Other Ambulatory Visit: Payer: Self-pay

## 2019-08-10 ENCOUNTER — Ambulatory Visit
Admission: RE | Admit: 2019-08-10 | Discharge: 2019-08-10 | Disposition: A | Discharge status: Home / Self Care | Payer: 59 | Source: Ambulatory Visit | Attending: Internal Medicine | Admitting: Internal Medicine

## 2019-08-10 DIAGNOSIS — M5442 Lumbago with sciatica, left side: Secondary | ICD-10-CM | POA: Diagnosis not present

## 2019-08-10 DIAGNOSIS — M5441 Lumbago with sciatica, right side: Secondary | ICD-10-CM

## 2019-08-10 DIAGNOSIS — M545 Low back pain: Secondary | ICD-10-CM | POA: Diagnosis not present

## 2019-08-10 DIAGNOSIS — G8929 Other chronic pain: Secondary | ICD-10-CM | POA: Insufficient documentation

## 2019-08-11 DIAGNOSIS — Z20828 Contact with and (suspected) exposure to other viral communicable diseases: Secondary | ICD-10-CM | POA: Diagnosis not present

## 2019-08-11 DIAGNOSIS — U071 COVID-19: Secondary | ICD-10-CM | POA: Diagnosis not present

## 2019-08-11 DIAGNOSIS — J029 Acute pharyngitis, unspecified: Secondary | ICD-10-CM | POA: Diagnosis not present

## 2019-08-22 DIAGNOSIS — E039 Hypothyroidism, unspecified: Secondary | ICD-10-CM | POA: Diagnosis not present

## 2019-08-22 DIAGNOSIS — J042 Acute laryngotracheitis: Secondary | ICD-10-CM | POA: Diagnosis not present

## 2019-08-22 DIAGNOSIS — R05 Cough: Secondary | ICD-10-CM | POA: Diagnosis not present

## 2019-09-04 DIAGNOSIS — I10 Essential (primary) hypertension: Secondary | ICD-10-CM | POA: Diagnosis not present

## 2019-09-04 DIAGNOSIS — J042 Acute laryngotracheitis: Secondary | ICD-10-CM | POA: Diagnosis not present

## 2019-09-04 DIAGNOSIS — M5440 Lumbago with sciatica, unspecified side: Secondary | ICD-10-CM | POA: Diagnosis not present

## 2019-09-04 DIAGNOSIS — E039 Hypothyroidism, unspecified: Secondary | ICD-10-CM | POA: Diagnosis not present

## 2019-09-05 DIAGNOSIS — M5441 Lumbago with sciatica, right side: Secondary | ICD-10-CM | POA: Diagnosis not present

## 2019-09-05 DIAGNOSIS — M48061 Spinal stenosis, lumbar region without neurogenic claudication: Secondary | ICD-10-CM | POA: Diagnosis not present

## 2019-09-05 DIAGNOSIS — M5442 Lumbago with sciatica, left side: Secondary | ICD-10-CM | POA: Diagnosis not present

## 2019-09-05 DIAGNOSIS — M47816 Spondylosis without myelopathy or radiculopathy, lumbar region: Secondary | ICD-10-CM | POA: Diagnosis not present

## 2019-09-05 DIAGNOSIS — G8929 Other chronic pain: Secondary | ICD-10-CM | POA: Insufficient documentation

## 2019-09-24 DIAGNOSIS — R69 Illness, unspecified: Secondary | ICD-10-CM | POA: Diagnosis not present

## 2019-09-24 DIAGNOSIS — L03818 Cellulitis of other sites: Secondary | ICD-10-CM | POA: Diagnosis not present

## 2019-09-24 DIAGNOSIS — K1230 Oral mucositis (ulcerative), unspecified: Secondary | ICD-10-CM | POA: Diagnosis not present

## 2019-09-24 DIAGNOSIS — K130 Diseases of lips: Secondary | ICD-10-CM | POA: Diagnosis not present

## 2019-09-24 DIAGNOSIS — K121 Other forms of stomatitis: Secondary | ICD-10-CM | POA: Diagnosis not present

## 2019-09-24 DIAGNOSIS — Z114 Encounter for screening for human immunodeficiency virus [HIV]: Secondary | ICD-10-CM | POA: Diagnosis not present

## 2019-09-30 DIAGNOSIS — L03818 Cellulitis of other sites: Secondary | ICD-10-CM | POA: Diagnosis not present

## 2019-09-30 DIAGNOSIS — K130 Diseases of lips: Secondary | ICD-10-CM | POA: Diagnosis not present

## 2019-09-30 DIAGNOSIS — L26 Exfoliative dermatitis: Secondary | ICD-10-CM | POA: Diagnosis not present

## 2019-09-30 DIAGNOSIS — K121 Other forms of stomatitis: Secondary | ICD-10-CM | POA: Diagnosis not present

## 2019-10-27 DIAGNOSIS — M48061 Spinal stenosis, lumbar region without neurogenic claudication: Secondary | ICD-10-CM | POA: Diagnosis not present

## 2019-10-27 DIAGNOSIS — M5441 Lumbago with sciatica, right side: Secondary | ICD-10-CM | POA: Diagnosis not present

## 2019-10-27 DIAGNOSIS — M5442 Lumbago with sciatica, left side: Secondary | ICD-10-CM | POA: Diagnosis not present

## 2019-10-27 DIAGNOSIS — M47816 Spondylosis without myelopathy or radiculopathy, lumbar region: Secondary | ICD-10-CM | POA: Diagnosis not present

## 2019-11-17 ENCOUNTER — Other Ambulatory Visit: Payer: Self-pay

## 2019-11-17 ENCOUNTER — Ambulatory Visit
Admission: RE | Admit: 2019-11-17 | Discharge: 2019-11-17 | Disposition: A | Discharge status: Home / Self Care | Payer: 59 | Source: Ambulatory Visit | Attending: Internal Medicine | Admitting: Internal Medicine

## 2019-11-17 ENCOUNTER — Other Ambulatory Visit: Payer: Self-pay | Admitting: Internal Medicine

## 2019-11-17 ENCOUNTER — Other Ambulatory Visit
Admission: RE | Admit: 2019-11-17 | Discharge: 2019-11-17 | Disposition: A | Discharge status: Home / Self Care | Payer: 59 | Source: Ambulatory Visit | Attending: Internal Medicine | Admitting: Internal Medicine

## 2019-11-17 DIAGNOSIS — R079 Chest pain, unspecified: Secondary | ICD-10-CM | POA: Diagnosis not present

## 2019-11-17 DIAGNOSIS — R071 Chest pain on breathing: Secondary | ICD-10-CM | POA: Insufficient documentation

## 2019-11-17 DIAGNOSIS — R110 Nausea: Secondary | ICD-10-CM | POA: Diagnosis not present

## 2019-11-17 DIAGNOSIS — I10 Essential (primary) hypertension: Secondary | ICD-10-CM | POA: Diagnosis not present

## 2019-11-17 DIAGNOSIS — R52 Pain, unspecified: Secondary | ICD-10-CM | POA: Diagnosis not present

## 2019-11-17 LAB — TROPONIN I (HIGH SENSITIVITY): Troponin I (High Sensitivity): 10 ng/L (ref <18)

## 2019-11-17 LAB — POCT I-STAT CREATININE: Creatinine, Ser: 0.9 mg/dL (ref 0.44–1.00)

## 2019-11-17 MED ORDER — IOHEXOL 350 MG/ML SOLN
75.0000 mL | Freq: Once | INTRAVENOUS | Status: AC | PRN
  Administered 2019-11-17: 75 mL via INTRAVENOUS

## 2019-12-15 ENCOUNTER — Other Ambulatory Visit: Payer: Self-pay

## 2019-12-15 ENCOUNTER — Ambulatory Visit
Admission: RE | Admit: 2019-12-15 | Discharge: 2019-12-15 | Disposition: A | Discharge status: Home / Self Care | Payer: 59 | Source: Ambulatory Visit | Attending: Internal Medicine | Admitting: Internal Medicine

## 2019-12-15 DIAGNOSIS — Z1231 Encounter for screening mammogram for malignant neoplasm of breast: Secondary | ICD-10-CM | POA: Diagnosis not present

## 2019-12-19 ENCOUNTER — Other Ambulatory Visit: Payer: Self-pay | Admitting: Internal Medicine

## 2019-12-19 DIAGNOSIS — N6489 Other specified disorders of breast: Secondary | ICD-10-CM

## 2019-12-19 DIAGNOSIS — R928 Other abnormal and inconclusive findings on diagnostic imaging of breast: Secondary | ICD-10-CM

## 2019-12-31 ENCOUNTER — Other Ambulatory Visit: Payer: Self-pay

## 2019-12-31 ENCOUNTER — Ambulatory Visit
Admission: RE | Admit: 2019-12-31 | Discharge: 2019-12-31 | Disposition: A | Discharge status: Home / Self Care | Payer: 59 | Source: Ambulatory Visit | Attending: Internal Medicine | Admitting: Internal Medicine

## 2019-12-31 DIAGNOSIS — N6489 Other specified disorders of breast: Secondary | ICD-10-CM | POA: Diagnosis not present

## 2019-12-31 DIAGNOSIS — R928 Other abnormal and inconclusive findings on diagnostic imaging of breast: Secondary | ICD-10-CM | POA: Diagnosis not present

## 2020-01-08 ENCOUNTER — Other Ambulatory Visit: Payer: Self-pay | Admitting: Internal Medicine

## 2020-01-08 DIAGNOSIS — N6489 Other specified disorders of breast: Secondary | ICD-10-CM

## 2020-01-08 DIAGNOSIS — R928 Other abnormal and inconclusive findings on diagnostic imaging of breast: Secondary | ICD-10-CM

## 2020-01-15 ENCOUNTER — Ambulatory Visit
Admission: RE | Admit: 2020-01-15 | Discharge: 2020-01-15 | Disposition: A | Discharge status: Home / Self Care | Payer: 59 | Source: Ambulatory Visit | Attending: Internal Medicine | Admitting: Internal Medicine

## 2020-01-15 ENCOUNTER — Other Ambulatory Visit: Payer: Self-pay

## 2020-01-15 DIAGNOSIS — N6489 Other specified disorders of breast: Secondary | ICD-10-CM

## 2020-01-15 DIAGNOSIS — R928 Other abnormal and inconclusive findings on diagnostic imaging of breast: Secondary | ICD-10-CM

## 2020-01-16 LAB — SURGICAL PATHOLOGY

## 2020-02-12 DIAGNOSIS — B372 Candidiasis of skin and nail: Secondary | ICD-10-CM | POA: Diagnosis not present

## 2020-02-12 DIAGNOSIS — R140 Abdominal distension (gaseous): Secondary | ICD-10-CM | POA: Diagnosis not present

## 2020-03-25 DIAGNOSIS — Z780 Asymptomatic menopausal state: Secondary | ICD-10-CM | POA: Diagnosis not present

## 2020-03-25 DIAGNOSIS — E039 Hypothyroidism, unspecified: Secondary | ICD-10-CM | POA: Diagnosis not present

## 2020-03-25 DIAGNOSIS — Z0000 Encounter for general adult medical examination without abnormal findings: Secondary | ICD-10-CM | POA: Diagnosis not present

## 2020-03-25 DIAGNOSIS — I10 Essential (primary) hypertension: Secondary | ICD-10-CM | POA: Diagnosis not present

## 2020-03-25 DIAGNOSIS — R600 Localized edema: Secondary | ICD-10-CM | POA: Diagnosis not present

## 2020-03-25 DIAGNOSIS — I119 Hypertensive heart disease without heart failure: Secondary | ICD-10-CM | POA: Diagnosis not present

## 2020-03-25 DIAGNOSIS — R6882 Decreased libido: Secondary | ICD-10-CM | POA: Diagnosis not present

## 2020-05-06 DIAGNOSIS — I119 Hypertensive heart disease without heart failure: Secondary | ICD-10-CM | POA: Diagnosis not present

## 2020-05-06 DIAGNOSIS — E669 Obesity, unspecified: Secondary | ICD-10-CM | POA: Diagnosis not present

## 2020-05-06 DIAGNOSIS — R5382 Chronic fatigue, unspecified: Secondary | ICD-10-CM | POA: Diagnosis not present

## 2020-05-06 DIAGNOSIS — I10 Essential (primary) hypertension: Secondary | ICD-10-CM | POA: Diagnosis not present

## 2020-06-17 DIAGNOSIS — I119 Hypertensive heart disease without heart failure: Secondary | ICD-10-CM | POA: Diagnosis not present

## 2020-06-22 DIAGNOSIS — G8929 Other chronic pain: Secondary | ICD-10-CM | POA: Diagnosis not present

## 2020-06-22 DIAGNOSIS — M5441 Lumbago with sciatica, right side: Secondary | ICD-10-CM | POA: Diagnosis not present

## 2020-06-22 DIAGNOSIS — M5442 Lumbago with sciatica, left side: Secondary | ICD-10-CM | POA: Diagnosis not present

## 2020-06-22 DIAGNOSIS — M48061 Spinal stenosis, lumbar region without neurogenic claudication: Secondary | ICD-10-CM | POA: Diagnosis not present

## 2020-06-25 DIAGNOSIS — E669 Obesity, unspecified: Secondary | ICD-10-CM | POA: Diagnosis not present

## 2020-06-25 DIAGNOSIS — H6691 Otitis media, unspecified, right ear: Secondary | ICD-10-CM | POA: Diagnosis not present

## 2020-06-25 DIAGNOSIS — E039 Hypothyroidism, unspecified: Secondary | ICD-10-CM | POA: Diagnosis not present

## 2020-06-25 DIAGNOSIS — I517 Cardiomegaly: Secondary | ICD-10-CM | POA: Diagnosis not present

## 2020-06-29 DIAGNOSIS — I10 Essential (primary) hypertension: Secondary | ICD-10-CM | POA: Diagnosis not present

## 2020-06-29 DIAGNOSIS — R0602 Shortness of breath: Secondary | ICD-10-CM | POA: Insufficient documentation

## 2020-06-29 DIAGNOSIS — I517 Cardiomegaly: Secondary | ICD-10-CM | POA: Insufficient documentation

## 2020-06-29 DIAGNOSIS — I248 Other forms of acute ischemic heart disease: Secondary | ICD-10-CM | POA: Diagnosis not present

## 2020-06-29 DIAGNOSIS — R002 Palpitations: Secondary | ICD-10-CM | POA: Diagnosis not present

## 2020-06-30 DIAGNOSIS — Z1211 Encounter for screening for malignant neoplasm of colon: Secondary | ICD-10-CM | POA: Diagnosis not present

## 2020-07-19 DIAGNOSIS — R002 Palpitations: Secondary | ICD-10-CM | POA: Diagnosis not present

## 2020-07-19 DIAGNOSIS — R0602 Shortness of breath: Secondary | ICD-10-CM | POA: Diagnosis not present

## 2020-07-21 DIAGNOSIS — R002 Palpitations: Secondary | ICD-10-CM | POA: Diagnosis not present

## 2020-07-28 DIAGNOSIS — R0602 Shortness of breath: Secondary | ICD-10-CM | POA: Diagnosis not present

## 2020-07-28 DIAGNOSIS — E782 Mixed hyperlipidemia: Secondary | ICD-10-CM | POA: Diagnosis not present

## 2020-07-28 DIAGNOSIS — I10 Essential (primary) hypertension: Secondary | ICD-10-CM | POA: Diagnosis not present

## 2020-08-23 DIAGNOSIS — Z03818 Encounter for observation for suspected exposure to other biological agents ruled out: Secondary | ICD-10-CM | POA: Diagnosis not present

## 2020-08-23 DIAGNOSIS — R112 Nausea with vomiting, unspecified: Secondary | ICD-10-CM | POA: Diagnosis not present

## 2020-08-23 DIAGNOSIS — R509 Fever, unspecified: Secondary | ICD-10-CM | POA: Diagnosis not present

## 2020-09-25 DIAGNOSIS — G4733 Obstructive sleep apnea (adult) (pediatric): Secondary | ICD-10-CM | POA: Diagnosis not present

## 2020-09-27 DIAGNOSIS — I10 Essential (primary) hypertension: Secondary | ICD-10-CM | POA: Diagnosis not present

## 2020-09-27 DIAGNOSIS — L309 Dermatitis, unspecified: Secondary | ICD-10-CM | POA: Diagnosis not present

## 2020-09-27 DIAGNOSIS — R5382 Chronic fatigue, unspecified: Secondary | ICD-10-CM | POA: Diagnosis not present

## 2020-09-27 DIAGNOSIS — E039 Hypothyroidism, unspecified: Secondary | ICD-10-CM | POA: Diagnosis not present

## 2020-10-04 ENCOUNTER — Encounter: Payer: Self-pay | Admitting: *Deleted

## 2020-10-05 ENCOUNTER — Ambulatory Visit
Admission: RE | Admit: 2020-10-05 | Discharge: 2020-10-05 | Disposition: A | Discharge status: Home / Self Care | Payer: 59 | Attending: Gastroenterology | Admitting: Gastroenterology

## 2020-10-05 ENCOUNTER — Encounter: Payer: Self-pay | Admitting: *Deleted

## 2020-10-05 ENCOUNTER — Encounter
Admission: RE | Disposition: A | Discharge status: Home / Self Care | Payer: Self-pay | Source: Home / Self Care | Attending: Gastroenterology

## 2020-10-05 ENCOUNTER — Ambulatory Visit: Payer: 59 | Admitting: Certified Registered Nurse Anesthetist

## 2020-10-05 DIAGNOSIS — E039 Hypothyroidism, unspecified: Secondary | ICD-10-CM | POA: Insufficient documentation

## 2020-10-05 DIAGNOSIS — Z6838 Body mass index (BMI) 38.0-38.9, adult: Secondary | ICD-10-CM | POA: Diagnosis not present

## 2020-10-05 DIAGNOSIS — Z791 Long term (current) use of non-steroidal anti-inflammatories (NSAID): Secondary | ICD-10-CM | POA: Diagnosis not present

## 2020-10-05 DIAGNOSIS — I10 Essential (primary) hypertension: Secondary | ICD-10-CM | POA: Diagnosis not present

## 2020-10-05 DIAGNOSIS — Z91040 Latex allergy status: Secondary | ICD-10-CM | POA: Diagnosis not present

## 2020-10-05 DIAGNOSIS — E669 Obesity, unspecified: Secondary | ICD-10-CM | POA: Diagnosis not present

## 2020-10-05 DIAGNOSIS — Z888 Allergy status to other drugs, medicaments and biological substances status: Secondary | ICD-10-CM | POA: Insufficient documentation

## 2020-10-05 DIAGNOSIS — Z79899 Other long term (current) drug therapy: Secondary | ICD-10-CM | POA: Insufficient documentation

## 2020-10-05 DIAGNOSIS — E782 Mixed hyperlipidemia: Secondary | ICD-10-CM | POA: Diagnosis not present

## 2020-10-05 DIAGNOSIS — Z91030 Bee allergy status: Secondary | ICD-10-CM | POA: Diagnosis not present

## 2020-10-05 DIAGNOSIS — K5730 Diverticulosis of large intestine without perforation or abscess without bleeding: Secondary | ICD-10-CM | POA: Insufficient documentation

## 2020-10-05 DIAGNOSIS — Z1211 Encounter for screening for malignant neoplasm of colon: Secondary | ICD-10-CM | POA: Diagnosis not present

## 2020-10-05 DIAGNOSIS — Z884 Allergy status to anesthetic agent status: Secondary | ICD-10-CM | POA: Diagnosis not present

## 2020-10-05 DIAGNOSIS — E119 Type 2 diabetes mellitus without complications: Secondary | ICD-10-CM | POA: Insufficient documentation

## 2020-10-05 DIAGNOSIS — Z79890 Hormone replacement therapy (postmenopausal): Secondary | ICD-10-CM | POA: Diagnosis not present

## 2020-10-05 DIAGNOSIS — Z883 Allergy status to other anti-infective agents status: Secondary | ICD-10-CM | POA: Insufficient documentation

## 2020-10-05 DIAGNOSIS — K640 First degree hemorrhoids: Secondary | ICD-10-CM | POA: Insufficient documentation

## 2020-10-05 HISTORY — DX: Type 2 diabetes mellitus without complications: E11.9

## 2020-10-05 HISTORY — DX: Gastro-esophageal reflux disease without esophagitis: K21.9

## 2020-10-05 HISTORY — PX: COLONOSCOPY WITH PROPOFOL: SHX5780

## 2020-10-05 LAB — GLUCOSE, CAPILLARY: Glucose-Capillary: 81 mg/dL (ref 70–99)

## 2020-10-05 SURGERY — COLONOSCOPY WITH PROPOFOL
Anesthesia: General

## 2020-10-05 MED ORDER — PROPOFOL 500 MG/50ML IV EMUL
INTRAVENOUS | Status: AC
  Filled 2020-10-05: qty 50

## 2020-10-05 MED ORDER — SODIUM CHLORIDE 0.9 % IV SOLN
INTRAVENOUS | Status: DC
  Administered 2020-10-05: 20 mL/h via INTRAVENOUS

## 2020-10-05 MED ORDER — PROPOFOL 10 MG/ML IV BOLUS
INTRAVENOUS | Status: DC | PRN
  Administered 2020-10-05: 30 mg via INTRAVENOUS
  Administered 2020-10-05: 60 mg via INTRAVENOUS
  Administered 2020-10-05: 30 mg via INTRAVENOUS
  Administered 2020-10-05: 20 mg via INTRAVENOUS

## 2020-10-05 MED ORDER — PROPOFOL 500 MG/50ML IV EMUL
INTRAVENOUS | Status: DC | PRN
  Administered 2020-10-05: 140 ug/kg/min via INTRAVENOUS

## 2020-10-05 NOTE — H&P (Signed)
Outpatient short stay form Pre-procedure 10/05/2020  Carly Rubenstein, MD  Primary Physician: Gladstone Lighter, MD  Reason for visit:  Screening Colonoscopy  History of present illness:   50 y/o lady with history of hypothyroidism, hypertension, and obesity here for screening colonoscopy. No family history of GI malignancies. History of partial hysterectomy. No blood thinners.    Current Facility-Administered Medications:    0.9 %  sodium chloride infusion, , Intravenous, Continuous, Greco Gastelum, Hilton Cork, MD, Last Rate: 20 mL/hr at 10/05/20 0904, Continued from Pre-op at 10/05/20 0904  Medications Prior to Admission  Medication Sig Dispense Refill Last Dose   busPIRone (BUSPAR) 10 MG tablet Take by mouth daily.    Past Week   Cholecalciferol 125 MCG (5000 UT) capsule Take 5,000 Units by mouth daily.   Past Week   eletriptan (RELPAX) 40 MG tablet Take 1 tablet po as a single dose.  May repeat dose once in two hours 12 tablet 2 Past Week   EPINEPHrine 0.3 mg/0.3 mL IJ SOAJ injection Inject 0.3 mLs (0.3 mg total) into the muscle as needed for anaphylaxis. 1 each 0 Past Week   FLUoxetine (PROZAC) 40 MG capsule Take 40 mg by mouth daily.    Past Week   furosemide (LASIX) 40 MG tablet Take 40 mg by mouth.   Past Week   levothyroxine (SYNTHROID) 125 MCG tablet Take 125 mcg by mouth daily.   Past Week   meclizine (ANTIVERT) 25 MG tablet TAKE 1 TABLET (25 MG TOTAL) BY MOUTH 2 (TWO) TIMES DAILY AS NEEDED. 30 tablet 0 10/05/2020 at 0530   meloxicam (MOBIC) 15 MG tablet Take 15 mg by mouth daily.   Past Week   Multiple Vitamin (DAILY VITAMINS) tablet Take 1 tablet by mouth daily.   Past Week   pantoprazole (PROTONIX) 40 MG tablet Take 1 tablet (40 mg total) by mouth daily. 90 tablet 0 Past Week   Potassium Chloride Crys ER (KLOR-CON M20 PO) Take 1 tablet by mouth once. While taking lasix or fluid pill   Past Week   Semaglutide (OZEMPIC, 1 MG/DOSE, [place]) Inject into the skin once a week.   Past Week    topiramate (TOPAMAX) 25 MG tablet TAKE UP TO 4 TABLETS A DAY 120 tablet 3 Past Week   vitamin B-12 (CYANOCOBALAMIN) 1000 MCG tablet Take 1,000 mcg by mouth daily.   Past Week   atorvastatin (LIPITOR) 40 MG tablet Take 1 tablet (40 mg total) by mouth daily at 6 PM. 30 tablet 0    carvedilol (COREG) 3.125 MG tablet Take 1 tablet (3.125 mg total) by mouth 2 (two) times daily with a meal. 60 tablet 0    losartan (COZAAR) 25 MG tablet Take 1 tablet (25 mg total) by mouth daily. 30 tablet 0    verapamil (CALAN-SR) 120 MG CR tablet Take 1 tablet (120 mg total) by mouth daily. 30 tablet 0      Allergies  Allergen Reactions   Bee Venom Anaphylaxis   Fluconazole Rash and Swelling   Lidocaine Swelling    Pt states use of lidocaine causes swelling and carries epi pen for allergies   Latex Swelling and Rash     Past Medical History:  Diagnosis Date   Anxiety and depression    BRCA negative 05/2018   MyRisk neg except ATM and MSH2 VUS   Common migraine    Diabetes mellitus without complication ([place])    Family history of breast cancer    Family history of ovarian  cancer    GERD (gastroesophageal reflux disease)    H/O benign breast biopsy    Hypothyroidism    Increased risk of breast cancer 05/2018   IBIS=22.5%/riskscore=16.2%   LGSIL on Pap smear of cervix 1998   Menorrhagia    Vertigo     Review of systems:  Otherwise negative.    Physical Exam  Gen: Alert, oriented. Appears stated age.  HEENT: PERRLA. Lungs: No respiratory distress CV: RRR Abd: soft, benign, no masses Ext: No edema    Planned procedures: Proceed with colonoscopy. The patient understands the nature of the planned procedure, indications, risks, alternatives and potential complications including but not limited to bleeding, infection, perforation, damage to internal organs and possible oversedation/side effects from anesthesia. The patient agrees and gives consent to proceed.  Please refer to procedure notes  for findings, recommendations and patient disposition/instructions.     Carly Rubenstein, MD [facility]

## 2020-10-05 NOTE — Anesthesia Procedure Notes (Signed)
Date/Time: 10/05/2020 9:11 AM Performed by: Joanette Gula, Rahi Chandonnet, CRNA Pre-anesthesia Checklist: Patient identified and Emergency Drugs available Patient Re-evaluated:Patient Re-evaluated prior to induction Oxygen Delivery Method: Nasal cannula Induction Type: IV induction

## 2020-10-05 NOTE — Anesthesia Preprocedure Evaluation (Signed)
Anesthesia Evaluation  Patient identified by MRN, date of birth, ID band Patient awake    Reviewed: Allergy & Precautions, H&P , NPO status , Patient's Chart, lab work & pertinent test results, reviewed documented beta blocker date and time   Airway Mallampati: III   Neck ROM: full    Dental  (+) Teeth Intact   Pulmonary neg pulmonary ROS,    Pulmonary exam normal        Cardiovascular hypertension, On Medications negative cardio ROS Normal cardiovascular exam Rhythm:regular Rate:Normal     Neuro/Psych  Headaches, PSYCHIATRIC DISORDERS Anxiety Depression negative neurological ROS  negative psych ROS   GI/Hepatic negative GI ROS, Neg liver ROS, GERD  Medicated,  Endo/Other  negative endocrine ROSdiabetesHypothyroidism   Renal/GU negative Renal ROS  negative genitourinary   Musculoskeletal   Abdominal   Peds  Hematology negative hematology ROS (+)   Anesthesia Other Findings Past Medical History: No date: Anxiety and depression 05/2018: BRCA negative     Comment:  MyRisk neg except ATM and MSH2 VUS No date: Common migraine No date: Diabetes mellitus without complication ([place]) No date: Family history of breast cancer No date: Family history of ovarian cancer No date: GERD (gastroesophageal reflux disease) No date: H/O benign breast biopsy No date: Hypothyroidism 05/2018: Increased risk of breast cancer     Comment:  IBIS=22.5%/riskscore=16.2% 1998: LGSIL on Pap smear of cervix No date: Menorrhagia No date: Vertigo Past Surgical History: 2014: BREAST BIOPSY; Left     Comment:  benign 04/01/2015: BREAST BIOPSY; Left     Comment:  hamartoma 01/15/2019: BREAST BIOPSY; Left     Comment:  affirm bx, x marker, path pending 01/15/2019: BREAST BIOPSY; Left     Comment:  affirm bx, ribbon marker, path pending No date: CHOLECYSTECTOMY 02/04/2019: LEFT HEART CATH AND CORONARY ANGIOGRAPHY; N/A     Comment:   Procedure: LEFT HEART CATH AND CORONARY ANGIOGRAPHY;                Surgeon: Corey Skains, MD;  Location: [facility]               CV LAB;  Service: Cardiovascular;  Laterality: N/A; No date: TUBAL LIGATION 2002: VAGINAL HYSTERECTOMY     Comment:  Dr. Rayford Halsted; due to menorrhagia/anemia BMI    Body Mass Index: 38.27 kg/m     Reproductive/Obstetrics negative OB ROS                             Anesthesia Physical Anesthesia Plan  ASA: 3  Anesthesia Plan: General   Post-op Pain Management:    Induction:   PONV Risk Score and Plan:   Airway Management Planned:   Additional Equipment:   Intra-op Plan:   Post-operative Plan:   Informed Consent: I have reviewed the patients History and Physical, chart, labs and discussed the procedure including the risks, benefits and alternatives for the proposed anesthesia with the patient or authorized representative who has indicated his/her understanding and acceptance.     Dental Advisory Given  Plan Discussed with: CRNA  Anesthesia Plan Comments:         Anesthesia Quick Evaluation

## 2020-10-05 NOTE — Interval H&P Note (Signed)
History and Physical Interval Note:  10/05/2020 9:07 AM  Carly Norton  has presented today for surgery, with the diagnosis of SCREENING.  The various methods of treatment have been discussed with the patient and family. After consideration of risks, benefits and other options for treatment, the patient has consented to  Procedure(s): COLONOSCOPY WITH PROPOFOL (N/A) as a surgical intervention.  The patient's history has been reviewed, patient examined, no change in status, stable for surgery.  I have reviewed the patient's chart and labs.  Questions were answered to the patient's satisfaction.     Regis Bill  Ok to proceed with colonoscopy

## 2020-10-05 NOTE — Anesthesia Postprocedure Evaluation (Signed)
Anesthesia Post Note  Patient: Carly Norton  Procedure(s) Performed: COLONOSCOPY WITH PROPOFOL  Patient location during evaluation: PACU Anesthesia Type: General Level of consciousness: awake and alert Pain management: pain level controlled Vital Signs Assessment: post-procedure vital signs reviewed and stable Respiratory status: spontaneous breathing, nonlabored ventilation, respiratory function stable and patient connected to nasal cannula oxygen Cardiovascular status: blood pressure returned to baseline and stable Postop Assessment: no apparent nausea or vomiting Anesthetic complications: no   No notable events documented.   Last Vitals:  Vitals:   10/05/20 0832 10/05/20 0933  BP: (!) 150/83 (!) 110/56  Pulse: 69 78  Resp: 20 16  Temp: (!) 36.1 C (!) 36 C  SpO2: 99% 93%    Last Pain:  Vitals:   10/05/20 0933  TempSrc: Temporal  PainSc: 0-No pain                 Yevette Edwards

## 2020-10-05 NOTE — Op Note (Signed)
[facility] Gastroenterology Patient Name: Carly Norton Procedure Date: 10/05/2020 9:08 AM MRN: [id number] Account #: [id number] Date of Birth: 09-24-1970 Admit Type: Outpatient Age: 50 Room: [facility] ENDO ROOM 1 Gender: Female Note Status: Finalized Procedure:             Colonoscopy Indications:           Screening for colorectal malignant neoplasm Providers:             Andrey Farmer MD, MD Referring MD:          Gladstone Lighter, MD (Referring MD) Medicines:             Monitored Anesthesia Care Complications:         No immediate complications. Procedure:             Pre-Anesthesia Assessment:                        - Prior to the procedure, a History and Physical was                         performed, and patient medications and allergies were                         reviewed. The patient is competent. The risks and                         benefits of the procedure and the sedation options and                         risks were discussed with the patient. All questions                         were answered and informed consent was obtained.                         Patient identification and proposed procedure were                         verified by the physician, the nurse, the anesthetist                         and the technician in the endoscopy suite. Mental                         Status Examination: alert and oriented. Airway                         Examination: normal oropharyngeal airway and neck                         mobility. Respiratory Examination: clear to                         auscultation. CV Examination: normal. Prophylactic                         Antibiotics: The patient does not require prophylactic  antibiotics. Prior Anticoagulants: The patient has                         taken no previous anticoagulant or antiplatelet                         agents. ASA Grade Assessment: II - A patient with mild                          systemic disease. After reviewing the risks and                         benefits, the patient was deemed in satisfactory                         condition to undergo the procedure. The anesthesia                         plan was to use monitored anesthesia care (MAC).                         Immediately prior to administration of medications,                         the patient was re-assessed for adequacy to receive                         sedatives. The heart rate, respiratory rate, oxygen                         saturations, blood pressure, adequacy of pulmonary                         ventilation, and response to care were monitored                         throughout the procedure. The physical status of the                         patient was re-assessed after the procedure.                        After obtaining informed consent, the colonoscope was                         passed under direct vision. Throughout the procedure,                         the patient's blood pressure, pulse, and oxygen                         saturations were monitored continuously. The                         Colonoscope was introduced through the anus and                         advanced to the the cecum, identified by appendiceal  orifice and ileocecal valve. The colonoscopy was                         performed without difficulty. The patient tolerated                         the procedure well. The quality of the bowel                         preparation was good. Findings:      The perianal and digital rectal examinations were normal.      A few small-mouthed diverticula were found in the hepatic flexure.      A few small-mouthed diverticula were found in the sigmoid colon.      Internal hemorrhoids were found during retroflexion. The hemorrhoids       were Grade I (internal hemorrhoids that do not prolapse).      The exam was otherwise without abnormality on direct  and retroflexion       views. Impression:            - Diverticulosis at the hepatic flexure.                        - Diverticulosis in the sigmoid colon.                        - Internal hemorrhoids.                        - The examination was otherwise normal on direct and                         retroflexion views.                        - No specimens collected. Recommendation:        - Discharge patient to home.                        - Resume previous diet.                        - Continue present medications.                        - Repeat colonoscopy in 10 years for screening                         purposes.                        - Return to referring physician as previously                         scheduled. Procedure Code(s):     --- Professional ---                        G6659, Colorectal cancer screening; colonoscopy on                         individual not meeting criteria for high risk Diagnosis Code(s):     ---  Professional ---                        Z12.11, Encounter for screening for malignant neoplasm                         of colon                        K64.0, First degree hemorrhoids                        K57.30, Diverticulosis of large intestine without                         perforation or abscess without bleeding CPT copyright 2019 American Medical Association. All rights reserved. The codes documented in this report are preliminary and upon coder review may  be revised to meet current compliance requirements. Andrey Farmer MD, MD 10/05/2020 9:32:21 AM Number of Addenda: 0 Note Initiated On: 10/05/2020 9:08 AM Scope Withdrawal Time: 0 hours 9 minutes 43 seconds  Total Procedure Duration: 0 hours 15 minutes 10 seconds  Estimated Blood Loss:  Estimated blood loss: none.      [facility]

## 2020-10-05 NOTE — Transfer of Care (Signed)
Immediate Anesthesia Transfer of Care Note  Patient: Carly Norton  Procedure(s) Performed: COLONOSCOPY WITH PROPOFOL  Patient Location: Endoscopy Unit  Anesthesia Type:General  Level of Consciousness: drowsy  Airway & Oxygen Therapy: Patient Spontanous Breathing  Post-op Assessment: Report given to RN and Post -op Vital signs reviewed and stable  Post vital signs: Reviewed and stable  Last Vitals:  Vitals Value Taken Time  BP 110/56 10/05/20 0933  Temp    Pulse 78 10/05/20 0933  Resp 16 10/05/20 0933  SpO2 95 % 10/05/20 0933  Vitals shown include unvalidated device data.  Last Pain:  Vitals:   10/05/20 0832  TempSrc: Temporal  PainSc: 0-No pain         Complications: No notable events documented.

## 2020-10-06 ENCOUNTER — Encounter: Payer: Self-pay | Admitting: Gastroenterology

## 2020-10-14 DIAGNOSIS — G4733 Obstructive sleep apnea (adult) (pediatric): Secondary | ICD-10-CM | POA: Insufficient documentation

## 2020-10-14 DIAGNOSIS — R0602 Shortness of breath: Secondary | ICD-10-CM | POA: Diagnosis not present

## 2020-10-14 DIAGNOSIS — I10 Essential (primary) hypertension: Secondary | ICD-10-CM | POA: Diagnosis not present

## 2020-10-14 DIAGNOSIS — E782 Mixed hyperlipidemia: Secondary | ICD-10-CM | POA: Diagnosis not present

## 2020-11-05 DIAGNOSIS — G4733 Obstructive sleep apnea (adult) (pediatric): Secondary | ICD-10-CM | POA: Diagnosis not present

## 2020-11-16 ENCOUNTER — Other Ambulatory Visit: Payer: Self-pay

## 2020-11-16 NOTE — Progress Notes (Signed)
[facility] specimen ID [id number]

## 2020-11-22 DIAGNOSIS — J40 Bronchitis, not specified as acute or chronic: Secondary | ICD-10-CM | POA: Diagnosis not present

## 2020-11-22 DIAGNOSIS — J329 Chronic sinusitis, unspecified: Secondary | ICD-10-CM | POA: Diagnosis not present

## 2020-11-22 DIAGNOSIS — J101 Influenza due to other identified influenza virus with other respiratory manifestations: Secondary | ICD-10-CM | POA: Diagnosis not present

## 2020-11-22 DIAGNOSIS — Z03818 Encounter for observation for suspected exposure to other biological agents ruled out: Secondary | ICD-10-CM | POA: Diagnosis not present

## 2020-11-29 ENCOUNTER — Other Ambulatory Visit: Payer: Self-pay | Admitting: Internal Medicine

## 2020-11-29 ENCOUNTER — Other Ambulatory Visit
Admission: RE | Admit: 2020-11-29 | Discharge: 2020-11-29 | Disposition: A | Discharge status: Home / Self Care | Payer: 59 | Source: Ambulatory Visit | Attending: Student | Admitting: Student

## 2020-11-29 DIAGNOSIS — R051 Acute cough: Secondary | ICD-10-CM | POA: Diagnosis not present

## 2020-11-29 DIAGNOSIS — Z1231 Encounter for screening mammogram for malignant neoplasm of breast: Secondary | ICD-10-CM

## 2020-11-29 DIAGNOSIS — I10 Essential (primary) hypertension: Secondary | ICD-10-CM | POA: Diagnosis not present

## 2020-11-29 DIAGNOSIS — J209 Acute bronchitis, unspecified: Secondary | ICD-10-CM | POA: Diagnosis not present

## 2020-11-29 DIAGNOSIS — R0602 Shortness of breath: Secondary | ICD-10-CM | POA: Diagnosis not present

## 2020-11-29 LAB — TROPONIN I (HIGH SENSITIVITY): Troponin I (High Sensitivity): 10 ng/L (ref <18)

## 2020-11-29 LAB — D-DIMER, QUANTITATIVE: D-Dimer, Quant: 0.36 ug/mL-FEU (ref 0.00–0.50)

## 2020-12-05 DIAGNOSIS — G4733 Obstructive sleep apnea (adult) (pediatric): Secondary | ICD-10-CM | POA: Diagnosis not present

## 2020-12-06 DIAGNOSIS — E039 Hypothyroidism, unspecified: Secondary | ICD-10-CM | POA: Diagnosis not present

## 2020-12-06 DIAGNOSIS — I10 Essential (primary) hypertension: Secondary | ICD-10-CM | POA: Diagnosis not present

## 2020-12-06 DIAGNOSIS — L309 Dermatitis, unspecified: Secondary | ICD-10-CM | POA: Diagnosis not present

## 2020-12-16 ENCOUNTER — Ambulatory Visit
Admission: RE | Admit: 2020-12-16 | Discharge: 2020-12-16 | Disposition: A | Discharge status: Home / Self Care | Payer: 59 | Source: Ambulatory Visit | Attending: Internal Medicine | Admitting: Internal Medicine

## 2020-12-16 ENCOUNTER — Other Ambulatory Visit: Payer: Self-pay

## 2020-12-16 DIAGNOSIS — Z1231 Encounter for screening mammogram for malignant neoplasm of breast: Secondary | ICD-10-CM | POA: Insufficient documentation

## 2020-12-23 ENCOUNTER — Encounter: Payer: Self-pay | Admitting: Physician Assistant

## 2020-12-23 ENCOUNTER — Ambulatory Visit: Payer: Self-pay | Admitting: Physician Assistant

## 2020-12-23 ENCOUNTER — Other Ambulatory Visit: Payer: Self-pay

## 2020-12-23 VITALS — BP 120/80 | HR 72 | Temp 97.1°F | Resp 16 | Ht 65.0 in | Wt 232.0 lb

## 2020-12-23 DIAGNOSIS — L03012 Cellulitis of left finger: Secondary | ICD-10-CM

## 2020-12-23 MED ORDER — SULFAMETHOXAZOLE-TRIMETHOPRIM 800-160 MG PO TABS: 1.0000 | ORAL_TABLET | Freq: Two times a day (BID) | ORAL | 0 refills | Status: DC

## 2020-12-23 MED ORDER — NAPROXEN 500 MG PO TABS: 500.0000 mg | ORAL_TABLET | Freq: Two times a day (BID) | ORAL | 0 refills | Status: DC

## 2020-12-23 NOTE — Progress Notes (Signed)
   Subjective:infected finger     Patient ID: Carly Norton, female    DOB: 1970/02/22, 50 y.o.   MRN: [id number]  HPI Patient presents with edema and erythema to the medial nailbed of the fourth digit left hand for 1 day.  Patient states throbbing but no drainage.  Patient rates pain as 3/10.  Patient described the pain as "achy".  No palliative measure for complaint.   Review of Systems Anxiety, depression, hyperlipidemia, and hypertension.    Objective:   Physical Exam  No acute distress.  Temperature 97.1, pulse 72, respiration 14, BP is 120/80, patient 99 percent O2 sat on room air.  Patient weighs 232 pounds BMI is 38.6. Obvious edema and erythema to the medial nailbed of the fourth digit left hand.  No active drainage.  Area is not fluctuant.      Assessment & Plan:Paronychia   Discussed with patient rationale for not incised and drained at this time.  Conservative treatment consist of Epson salt for 5 to 10 minutes twice a day, Bactrim DS, and naproxen.  Patient return back in 3 to 4 days if no improvement.

## 2020-12-23 NOTE — Progress Notes (Signed)
Pain & swelling started yesterday 4th finger cuticle area  Hasn't used anything.  AMD

## 2021-02-04 DIAGNOSIS — G4733 Obstructive sleep apnea (adult) (pediatric): Secondary | ICD-10-CM | POA: Diagnosis not present

## 2021-02-09 DIAGNOSIS — R1084 Generalized abdominal pain: Secondary | ICD-10-CM | POA: Diagnosis not present

## 2021-02-09 DIAGNOSIS — I10 Essential (primary) hypertension: Secondary | ICD-10-CM | POA: Diagnosis not present

## 2021-02-09 DIAGNOSIS — R112 Nausea with vomiting, unspecified: Secondary | ICD-10-CM | POA: Diagnosis not present

## 2021-02-09 DIAGNOSIS — G4733 Obstructive sleep apnea (adult) (pediatric): Secondary | ICD-10-CM | POA: Diagnosis not present

## 2021-02-10 ENCOUNTER — Other Ambulatory Visit: Payer: Self-pay | Admitting: Internal Medicine

## 2021-02-10 DIAGNOSIS — R1084 Generalized abdominal pain: Secondary | ICD-10-CM

## 2021-02-15 ENCOUNTER — Ambulatory Visit
Admission: RE | Admit: 2021-02-15 | Discharge: 2021-02-15 | Disposition: A | Discharge status: Home / Self Care | Payer: 59 | Source: Ambulatory Visit | Attending: Internal Medicine | Admitting: Internal Medicine

## 2021-02-15 DIAGNOSIS — R1084 Generalized abdominal pain: Secondary | ICD-10-CM

## 2021-02-15 DIAGNOSIS — R109 Unspecified abdominal pain: Secondary | ICD-10-CM | POA: Diagnosis not present

## 2021-02-15 LAB — POCT I-STAT CREATININE: Creatinine, Ser: 1.2 mg/dL — ABNORMAL HIGH (ref 0.44–1.00)

## 2021-02-15 MED ORDER — IOHEXOL 300 MG/ML  SOLN
100.0000 mL | Freq: Once | INTRAMUSCULAR | Status: AC | PRN
  Administered 2021-02-15: 100 mL via INTRAVENOUS

## 2021-03-07 DIAGNOSIS — G4733 Obstructive sleep apnea (adult) (pediatric): Secondary | ICD-10-CM | POA: Diagnosis not present

## 2021-04-05 DIAGNOSIS — G4733 Obstructive sleep apnea (adult) (pediatric): Secondary | ICD-10-CM | POA: Diagnosis not present

## 2021-04-20 DIAGNOSIS — R002 Palpitations: Secondary | ICD-10-CM | POA: Insufficient documentation

## 2021-04-20 DIAGNOSIS — G4733 Obstructive sleep apnea (adult) (pediatric): Secondary | ICD-10-CM | POA: Diagnosis not present

## 2021-04-20 DIAGNOSIS — I10 Essential (primary) hypertension: Secondary | ICD-10-CM | POA: Diagnosis not present

## 2021-04-20 DIAGNOSIS — E782 Mixed hyperlipidemia: Secondary | ICD-10-CM | POA: Diagnosis not present

## 2021-05-05 DIAGNOSIS — G4733 Obstructive sleep apnea (adult) (pediatric): Secondary | ICD-10-CM | POA: Diagnosis not present

## 2021-05-23 DIAGNOSIS — M5441 Lumbago with sciatica, right side: Secondary | ICD-10-CM | POA: Diagnosis not present

## 2021-05-23 DIAGNOSIS — M48062 Spinal stenosis, lumbar region with neurogenic claudication: Secondary | ICD-10-CM | POA: Diagnosis not present

## 2021-05-23 DIAGNOSIS — G8929 Other chronic pain: Secondary | ICD-10-CM | POA: Diagnosis not present

## 2021-05-23 DIAGNOSIS — M5442 Lumbago with sciatica, left side: Secondary | ICD-10-CM | POA: Diagnosis not present

## 2021-06-05 DIAGNOSIS — G4733 Obstructive sleep apnea (adult) (pediatric): Secondary | ICD-10-CM | POA: Diagnosis not present

## 2021-07-05 DIAGNOSIS — G4733 Obstructive sleep apnea (adult) (pediatric): Secondary | ICD-10-CM | POA: Diagnosis not present

## 2021-08-01 ENCOUNTER — Other Ambulatory Visit: Payer: Self-pay | Admitting: Family Medicine

## 2021-08-01 DIAGNOSIS — N644 Mastodynia: Secondary | ICD-10-CM | POA: Diagnosis not present

## 2021-08-01 DIAGNOSIS — N6323 Unspecified lump in the left breast, lower outer quadrant: Secondary | ICD-10-CM | POA: Diagnosis not present

## 2021-08-01 DIAGNOSIS — G8929 Other chronic pain: Secondary | ICD-10-CM | POA: Diagnosis not present

## 2021-08-01 DIAGNOSIS — M546 Pain in thoracic spine: Secondary | ICD-10-CM | POA: Diagnosis not present

## 2021-08-05 DIAGNOSIS — G4733 Obstructive sleep apnea (adult) (pediatric): Secondary | ICD-10-CM | POA: Diagnosis not present

## 2021-08-16 ENCOUNTER — Ambulatory Visit
Admission: RE | Admit: 2021-08-16 | Discharge: 2021-08-16 | Disposition: A | Discharge status: Home / Self Care | Payer: 59 | Source: Ambulatory Visit | Attending: Family Medicine | Admitting: Family Medicine

## 2021-08-16 DIAGNOSIS — R928 Other abnormal and inconclusive findings on diagnostic imaging of breast: Secondary | ICD-10-CM | POA: Diagnosis not present

## 2021-08-16 DIAGNOSIS — N644 Mastodynia: Secondary | ICD-10-CM

## 2021-08-16 DIAGNOSIS — N6323 Unspecified lump in the left breast, lower outer quadrant: Secondary | ICD-10-CM

## 2021-08-26 DIAGNOSIS — H9201 Otalgia, right ear: Secondary | ICD-10-CM | POA: Diagnosis not present

## 2021-08-26 DIAGNOSIS — H60501 Unspecified acute noninfective otitis externa, right ear: Secondary | ICD-10-CM | POA: Diagnosis not present

## 2021-09-04 DIAGNOSIS — G4733 Obstructive sleep apnea (adult) (pediatric): Secondary | ICD-10-CM | POA: Diagnosis not present

## 2021-09-05 DIAGNOSIS — Z780 Asymptomatic menopausal state: Secondary | ICD-10-CM | POA: Diagnosis not present

## 2021-09-05 DIAGNOSIS — E039 Hypothyroidism, unspecified: Secondary | ICD-10-CM | POA: Diagnosis not present

## 2021-09-05 DIAGNOSIS — Z0000 Encounter for general adult medical examination without abnormal findings: Secondary | ICD-10-CM | POA: Diagnosis not present

## 2021-09-05 DIAGNOSIS — G4730 Sleep apnea, unspecified: Secondary | ICD-10-CM | POA: Diagnosis not present

## 2021-09-05 DIAGNOSIS — H6504 Acute serous otitis media, recurrent, right ear: Secondary | ICD-10-CM | POA: Diagnosis not present

## 2021-09-05 DIAGNOSIS — H9201 Otalgia, right ear: Secondary | ICD-10-CM | POA: Diagnosis not present

## 2021-09-05 DIAGNOSIS — I10 Essential (primary) hypertension: Secondary | ICD-10-CM | POA: Diagnosis not present

## 2021-10-05 DIAGNOSIS — G4733 Obstructive sleep apnea (adult) (pediatric): Secondary | ICD-10-CM | POA: Diagnosis not present

## 2021-11-09 DIAGNOSIS — E669 Obesity, unspecified: Secondary | ICD-10-CM | POA: Diagnosis not present

## 2021-11-09 DIAGNOSIS — E039 Hypothyroidism, unspecified: Secondary | ICD-10-CM | POA: Diagnosis not present

## 2021-11-09 DIAGNOSIS — I10 Essential (primary) hypertension: Secondary | ICD-10-CM | POA: Diagnosis not present

## 2021-12-16 DIAGNOSIS — B338 Other specified viral diseases: Secondary | ICD-10-CM | POA: Diagnosis not present

## 2021-12-16 DIAGNOSIS — H60501 Unspecified acute noninfective otitis externa, right ear: Secondary | ICD-10-CM | POA: Diagnosis not present

## 2021-12-16 DIAGNOSIS — J019 Acute sinusitis, unspecified: Secondary | ICD-10-CM | POA: Diagnosis not present

## 2021-12-16 DIAGNOSIS — Z03818 Encounter for observation for suspected exposure to other biological agents ruled out: Secondary | ICD-10-CM | POA: Diagnosis not present

## 2022-01-26 ENCOUNTER — Other Ambulatory Visit
Admission: RE | Admit: 2022-01-26 | Discharge: 2022-01-26 | Disposition: A | Discharge status: Home / Self Care | Payer: 59 | Source: Ambulatory Visit | Attending: Family Medicine | Admitting: Family Medicine

## 2022-01-26 DIAGNOSIS — R0602 Shortness of breath: Secondary | ICD-10-CM | POA: Diagnosis not present

## 2022-01-26 DIAGNOSIS — M549 Dorsalgia, unspecified: Secondary | ICD-10-CM | POA: Diagnosis not present

## 2022-01-26 DIAGNOSIS — I517 Cardiomegaly: Secondary | ICD-10-CM | POA: Diagnosis not present

## 2022-01-26 DIAGNOSIS — R079 Chest pain, unspecified: Secondary | ICD-10-CM | POA: Insufficient documentation

## 2022-01-26 DIAGNOSIS — I10 Essential (primary) hypertension: Secondary | ICD-10-CM | POA: Diagnosis not present

## 2022-01-26 DIAGNOSIS — R600 Localized edema: Secondary | ICD-10-CM | POA: Diagnosis not present

## 2022-01-26 LAB — BRAIN NATRIURETIC PEPTIDE: B Natriuretic Peptide: 79.4 pg/mL (ref 0.0–100.0)

## 2022-02-13 ENCOUNTER — Encounter: Payer: 59 | Attending: Internal Medicine | Admitting: Dietician

## 2022-02-13 ENCOUNTER — Encounter: Payer: Self-pay | Admitting: Dietician

## 2022-02-13 VITALS — Ht 65.0 in | Wt 232.0 lb

## 2022-02-13 DIAGNOSIS — Z713 Dietary counseling and surveillance: Secondary | ICD-10-CM | POA: Diagnosis not present

## 2022-02-13 DIAGNOSIS — E669 Obesity, unspecified: Secondary | ICD-10-CM | POA: Insufficient documentation

## 2022-02-13 DIAGNOSIS — Z6838 Body mass index (BMI) 38.0-38.9, adult: Secondary | ICD-10-CM | POA: Diagnosis not present

## 2022-02-13 DIAGNOSIS — I10 Essential (primary) hypertension: Secondary | ICD-10-CM

## 2022-02-13 DIAGNOSIS — E039 Hypothyroidism, unspecified: Secondary | ICD-10-CM

## 2022-02-13 DIAGNOSIS — E66812 Obesity, class 2: Secondary | ICD-10-CM

## 2022-02-13 NOTE — Patient Instructions (Signed)
Continue to include plenty of salads, and have a fruit ideally 4 servings daily.  Add a whole grain or some beans or corn in salad for a healthy starch.  Keep finding ways to add steps/ activity throughout the day, great job so far!

## 2022-02-13 NOTE — Progress Notes (Signed)
Medical Nutrition Therapy: Visit start time: 0950  end time: 1055  Assessment:   Referral Diagnosis: obesity Other medical history/ diagnoses: hypertension, enlarged heart, hypothyroidism, sleep apnea, migraines Psychosocial issues/ stress concerns: depression, partially controlled with medication  Medications, supplements: reconciled list in medical record   Preferred learning method:  Auditory Hands-on    Current weight: 232.0lbs Height: 5'5" BMI: 38.61 Patient's personal weight goal: 160lbs  Progress and evaluation:  Patient has stopped coffee, drinks only water (no sodas); eating salads for lunch and dinner Does not eat most green veg other than salad (lettuce, tomato, cucumber, cheese) Was eatng dry mini wheats or biscuit/ pancakes in am; often out at lunch Food allergies: none; reports intolerance to dairy milk Special diet practices: none; considering trying fasting for weight loss Reports BP is acceptably controlled with medication Patient seeks help with improving quality of life with healthier diet and weight loss    Dietary Intake:  Usual eating pattern includes 3 meals and 2 snacks per day. Dining out frequency: ? meals per week. Who plans meals/ buys groceries? Self, spouse Who prepares meals? Spouse  Breakfast: premier protein shake Snack: cheese +/or grapes Lunch: protein shake or salad with lettuce, cucumber, tomato, cheese, small portion oil based dressing, sometimes with boiled egg or grilled chicken Snack: same as am Supper: salad (same as lunch) Snack: none Beverages: water, protein shakes  Physical activity: no regular exercise; has stand-up desk at work, adds steps during workday   Intervention:   Nutrition Care Education:   Basic nutrition: basic food groups; appropriate nutrient balance; appropriate meal and snack schedule; general nutrition guidelines    Weight control: determining reasonable weight loss rate; importance of low sugar and low  fat choices; estimated energy needs for weight loss at 1400-1500 kcal, provided guidance for 45% CHO, 25% pro, 30% fat; role of physical activity; benefits and drawbacks to fasting pattern Hypertension:  DASH diet for heart health and overall healthy eating pattern  Other intervention notes: Patient has been making significant lifestyle changes for weight loss and improved health. She is motivated to continue.  No further reduction in calories/ protein advised; established goals to meet basic nutritional needs and allow for variety of options.   Nutritional Diagnosis:  [place]-2.1 Inpaired nutrition utilization As related to hypertension.  As evidenced by BP controlled with medication. [place]-3.3 Overweight/obesity As related to history of excess calories, inadequate physical activity, hypothyroidism.  As evidenced by patient with current BMI of 38.6, working on lifestyle changes to promote weight loss.   Education Materials given:  Museum/gallery conservator with food lists, sample meal pattern DASH diet overview DASH diet tips Visit summary with goals/ instructions   Learner/ who was taught:  Patient    Level of understanding: Verbalizes/ demonstrates competency   Demonstrated degree of understanding via:   Teach back Learning barriers: None  Willingness to learn/ readiness for change: Eager, change in progress   Monitoring and Evaluation:  Dietary intake, exercise, and body weight      follow up:  04/03/22 at 11:00am

## 2022-03-02 DIAGNOSIS — R42 Dizziness and giddiness: Secondary | ICD-10-CM | POA: Diagnosis not present

## 2022-03-02 DIAGNOSIS — G43809 Other migraine, not intractable, without status migrainosus: Secondary | ICD-10-CM | POA: Diagnosis not present

## 2022-03-02 DIAGNOSIS — J209 Acute bronchitis, unspecified: Secondary | ICD-10-CM | POA: Diagnosis not present

## 2022-03-02 DIAGNOSIS — R040 Epistaxis: Secondary | ICD-10-CM | POA: Diagnosis not present

## 2022-03-03 ENCOUNTER — Other Ambulatory Visit: Payer: Self-pay | Admitting: Family Medicine

## 2022-03-03 DIAGNOSIS — R040 Epistaxis: Secondary | ICD-10-CM

## 2022-03-03 DIAGNOSIS — R42 Dizziness and giddiness: Secondary | ICD-10-CM

## 2022-03-03 DIAGNOSIS — G43809 Other migraine, not intractable, without status migrainosus: Secondary | ICD-10-CM

## 2022-03-22 DIAGNOSIS — I10 Essential (primary) hypertension: Secondary | ICD-10-CM | POA: Diagnosis not present

## 2022-03-22 DIAGNOSIS — R002 Palpitations: Secondary | ICD-10-CM | POA: Diagnosis not present

## 2022-03-22 DIAGNOSIS — R040 Epistaxis: Secondary | ICD-10-CM | POA: Diagnosis not present

## 2022-03-22 DIAGNOSIS — Z6839 Body mass index (BMI) 39.0-39.9, adult: Secondary | ICD-10-CM | POA: Diagnosis not present

## 2022-03-30 ENCOUNTER — Ambulatory Visit: Admission: RE | Admit: 2022-03-30 | Payer: 59 | Source: Ambulatory Visit

## 2022-04-03 ENCOUNTER — Ambulatory Visit: Payer: 59 | Admitting: Dietician

## 2022-04-04 ENCOUNTER — Ambulatory Visit
Admission: RE | Admit: 2022-04-04 | Discharge: 2022-04-04 | Disposition: A | Discharge status: Home / Self Care | Payer: 59 | Source: Ambulatory Visit | Attending: Family Medicine | Admitting: Family Medicine

## 2022-04-04 DIAGNOSIS — R42 Dizziness and giddiness: Secondary | ICD-10-CM | POA: Insufficient documentation

## 2022-04-04 DIAGNOSIS — G43809 Other migraine, not intractable, without status migrainosus: Secondary | ICD-10-CM

## 2022-04-04 DIAGNOSIS — G43909 Migraine, unspecified, not intractable, without status migrainosus: Secondary | ICD-10-CM | POA: Diagnosis not present

## 2022-04-04 DIAGNOSIS — R040 Epistaxis: Secondary | ICD-10-CM | POA: Diagnosis not present

## 2022-04-04 DIAGNOSIS — H538 Other visual disturbances: Secondary | ICD-10-CM | POA: Diagnosis not present

## 2022-04-04 MED ORDER — GADOBUTROL 1 MMOL/ML IV SOLN
10.0000 mL | Freq: Once | INTRAVENOUS | Status: AC | PRN
  Administered 2022-04-04: 10 mL via INTRAVENOUS

## 2022-04-19 DIAGNOSIS — E782 Mixed hyperlipidemia: Secondary | ICD-10-CM | POA: Diagnosis not present

## 2022-04-19 DIAGNOSIS — G4733 Obstructive sleep apnea (adult) (pediatric): Secondary | ICD-10-CM | POA: Diagnosis not present

## 2022-04-19 DIAGNOSIS — R002 Palpitations: Secondary | ICD-10-CM | POA: Diagnosis not present

## 2022-04-19 DIAGNOSIS — R0602 Shortness of breath: Secondary | ICD-10-CM | POA: Diagnosis not present

## 2022-04-19 DIAGNOSIS — I10 Essential (primary) hypertension: Secondary | ICD-10-CM | POA: Diagnosis not present

## 2022-04-22 IMAGING — MG MM DIGITAL DIAGNOSTIC UNILAT*L* W/ TOMO W/ CAD
6 series · 6 of 18 positions shown · non-contrast
Comparison: Previous exam(s).

CLINICAL DATA: History of LEFT breast hamartoma. Two anterior areas
of the hematoma have previously been biopsied. One in 9379
demonstrated PASH.

EXAM:
DIGITAL DIAGNOSTIC UNILATERAL LEFT MAMMOGRAM WITH TOMO AND CAD;
ULTRASOUND LEFT BREAST LIMITED

[L MLO synth-2D]
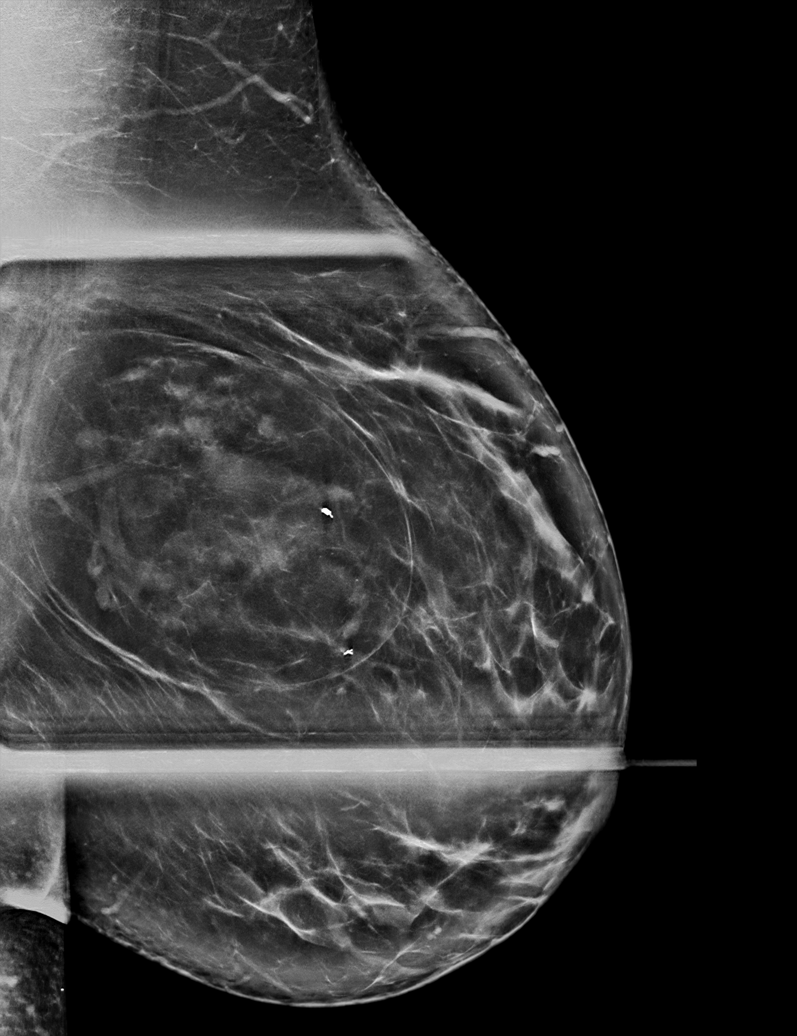

[L CC synth-2D]
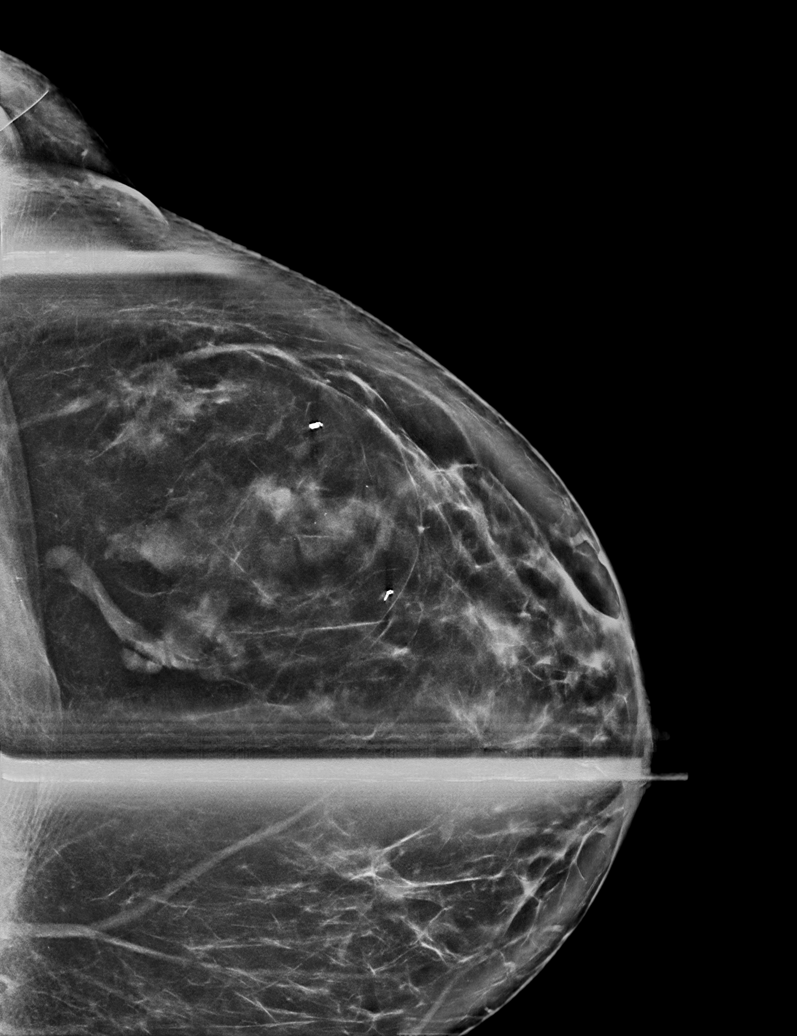

[L ML synth-2D]
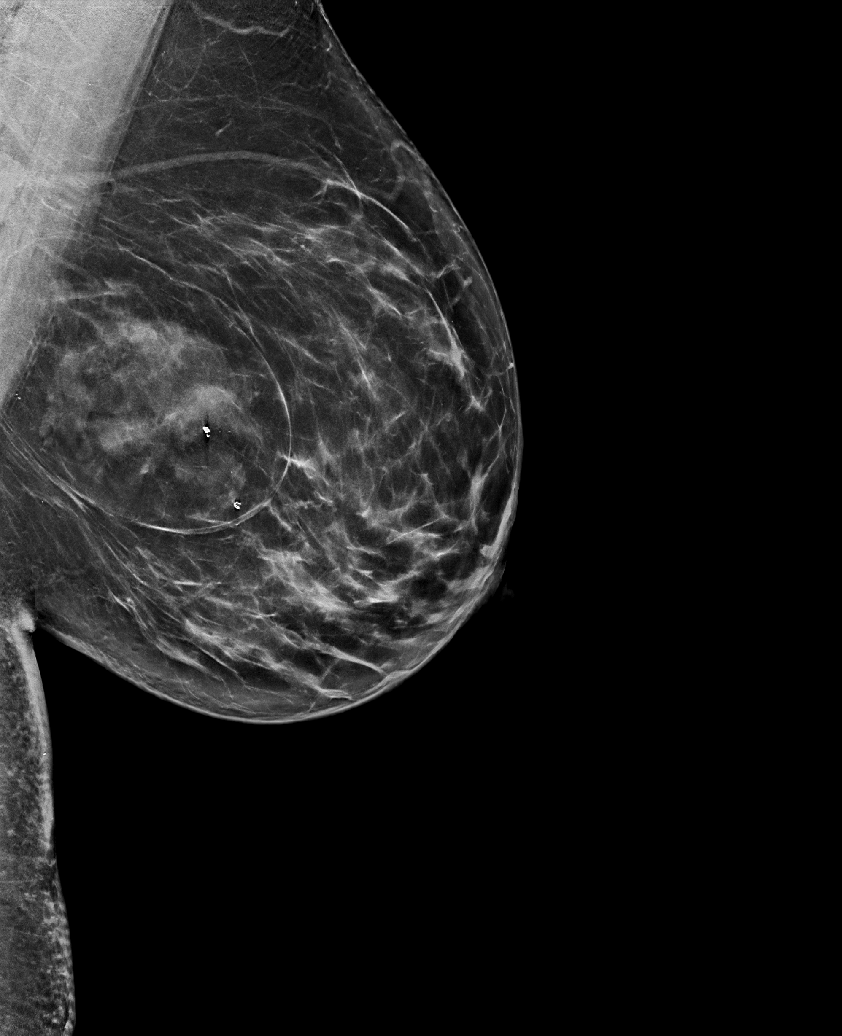

[L ML tomo · tomo slice 45/90.0]
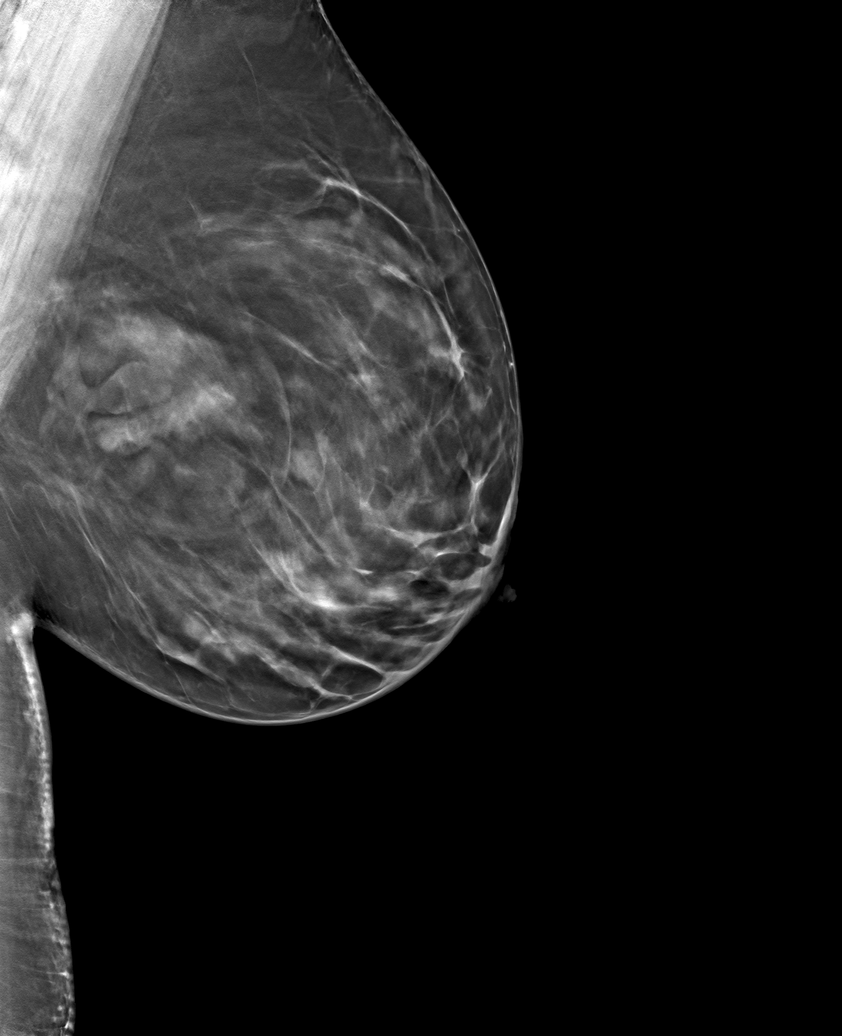

[L CC tomo · tomo slice 45/88.0]
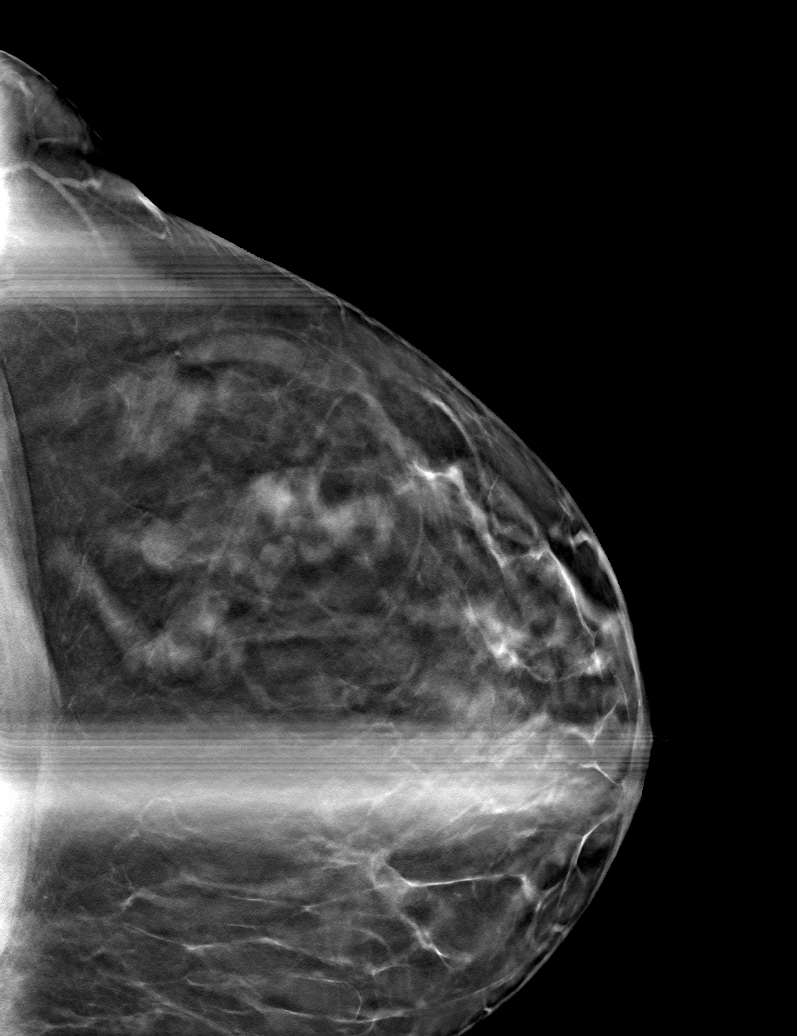

[L MLO tomo · tomo slice 48/95.0]
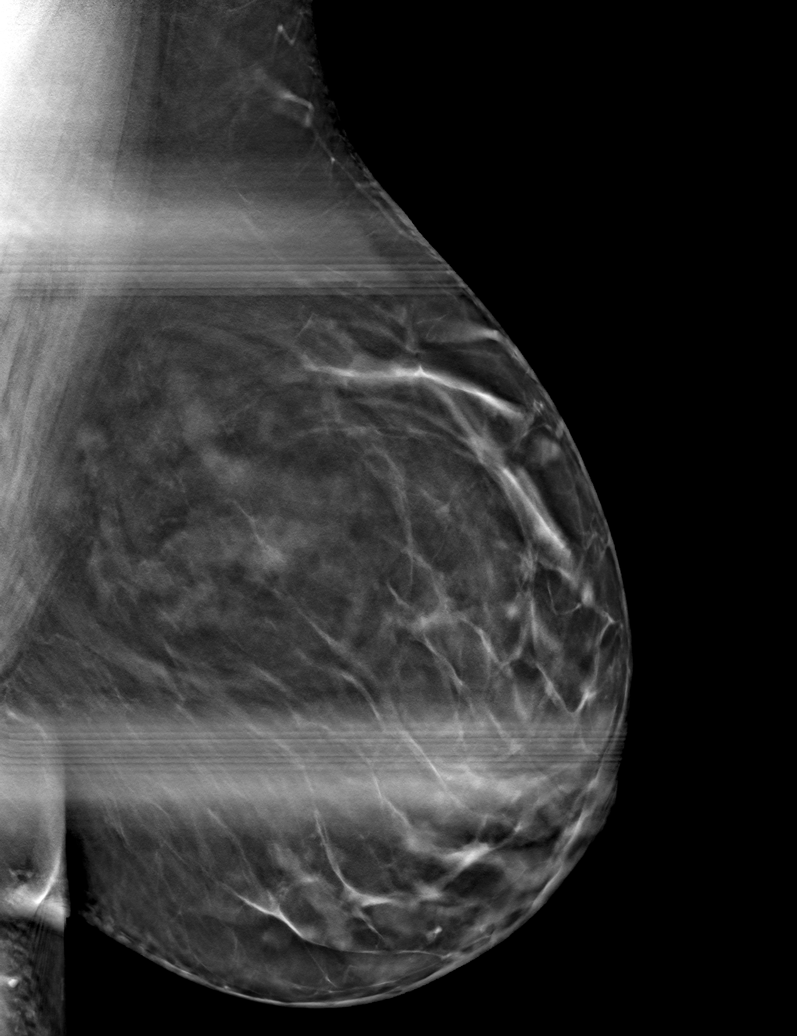

[6 of 18 positions shown; findings below may reference images not displayed]

ACR Breast Density Category b: There are scattered areas of
fibroglandular density.
FINDINGS: There has been continued increase in size of the LEFT breast
hamartoma. It measured 8.8 cm in anterior-posterior extent,
previously 6.5 cm in 9379. There is a developing asymmetry with 2
different mammographic textures within the posterior aspect of
hamartoma. There are 2 biopsy clips within the anterior aspect of
the hamartoma.

Mammographic images were processed with CAD.

On physical exam, there is a soft mobile mass in the LEFT outer
breast.

Targeted ultrasound is performed of the LEFT outer breast. There is
revisualization of a hamartoma with mixed heterogeneous internal
elements of different sonographic echo textures throughout the mass.
No discrete highly suspicious mass is seen. No definitive cysts are
seen to account for developing asymmetry.

Targeted ultrasound was performed of the LEFT axilla. No suspicious
axillary lymph nodes are seen.
IMPRESSION: 1. There is a developing asymmetry in the posterior aspect of the
LEFT breast hamartoma. The anterior aspect of the hematoma has been
biopsied twice with benign results including PASH. Though of low
suspicion, given the continued increase in size as well as the
developing asymmetry, recommend stereotactic guided biopsy of the
posterior aspect of the hamartoma at 2 sites for definitive
characterization.
2. No LEFT axillary adenopathy.

RECOMMENDATION:
LEFT breast stereotactic guided biopsy x2

I have discussed the findings and recommendations with the patient.
If applicable, a reminder letter will be sent to the patient
regarding the next appointment. Patient will be scheduled for biopsy
at her earliest convenience by the schedulers and ordering provider
will be notified.

BI-RADS CATEGORY  4: Suspicious.

## 2022-05-03 DIAGNOSIS — R002 Palpitations: Secondary | ICD-10-CM | POA: Diagnosis not present

## 2022-05-08 DIAGNOSIS — G8929 Other chronic pain: Secondary | ICD-10-CM | POA: Diagnosis not present

## 2022-05-08 DIAGNOSIS — I10 Essential (primary) hypertension: Secondary | ICD-10-CM | POA: Diagnosis not present

## 2022-05-08 DIAGNOSIS — M5441 Lumbago with sciatica, right side: Secondary | ICD-10-CM | POA: Diagnosis not present

## 2022-05-08 DIAGNOSIS — Z6839 Body mass index (BMI) 39.0-39.9, adult: Secondary | ICD-10-CM | POA: Diagnosis not present

## 2022-05-08 DIAGNOSIS — R0602 Shortness of breath: Secondary | ICD-10-CM | POA: Diagnosis not present

## 2022-05-08 DIAGNOSIS — R079 Chest pain, unspecified: Secondary | ICD-10-CM | POA: Diagnosis not present

## 2022-05-08 DIAGNOSIS — M5442 Lumbago with sciatica, left side: Secondary | ICD-10-CM | POA: Diagnosis not present

## 2022-05-10 ENCOUNTER — Encounter: Payer: Self-pay | Admitting: Dietician

## 2022-05-10 NOTE — Progress Notes (Signed)
Have not heard back from patient to reschedule her cancelled appointment from 04/03/22. Sent notification to referring provider.

## 2022-05-29 DIAGNOSIS — R002 Palpitations: Secondary | ICD-10-CM | POA: Diagnosis not present

## 2022-05-29 DIAGNOSIS — R0602 Shortness of breath: Secondary | ICD-10-CM | POA: Diagnosis not present

## 2022-06-05 DIAGNOSIS — R002 Palpitations: Secondary | ICD-10-CM | POA: Diagnosis not present

## 2022-06-19 DIAGNOSIS — M5442 Lumbago with sciatica, left side: Secondary | ICD-10-CM | POA: Diagnosis not present

## 2022-06-19 DIAGNOSIS — M5441 Lumbago with sciatica, right side: Secondary | ICD-10-CM | POA: Diagnosis not present

## 2022-06-19 DIAGNOSIS — G8929 Other chronic pain: Secondary | ICD-10-CM | POA: Diagnosis not present

## 2022-06-19 DIAGNOSIS — Z6839 Body mass index (BMI) 39.0-39.9, adult: Secondary | ICD-10-CM | POA: Diagnosis not present

## 2022-06-19 DIAGNOSIS — K130 Diseases of lips: Secondary | ICD-10-CM | POA: Diagnosis not present

## 2022-06-19 DIAGNOSIS — I10 Essential (primary) hypertension: Secondary | ICD-10-CM | POA: Diagnosis not present

## 2022-06-19 DIAGNOSIS — D638 Anemia in other chronic diseases classified elsewhere: Secondary | ICD-10-CM | POA: Diagnosis not present

## 2022-07-11 DIAGNOSIS — R21 Rash and other nonspecific skin eruption: Secondary | ICD-10-CM | POA: Diagnosis not present

## 2022-07-11 DIAGNOSIS — I10 Essential (primary) hypertension: Secondary | ICD-10-CM | POA: Diagnosis not present

## 2022-07-11 DIAGNOSIS — H5711 Ocular pain, right eye: Secondary | ICD-10-CM | POA: Diagnosis not present

## 2022-08-30 DIAGNOSIS — E039 Hypothyroidism, unspecified: Secondary | ICD-10-CM | POA: Diagnosis not present

## 2022-08-30 DIAGNOSIS — B9789 Other viral agents as the cause of diseases classified elsewhere: Secondary | ICD-10-CM | POA: Diagnosis not present

## 2022-08-30 DIAGNOSIS — M353 Polymyalgia rheumatica: Secondary | ICD-10-CM | POA: Diagnosis not present

## 2022-08-30 DIAGNOSIS — B349 Viral infection, unspecified: Secondary | ICD-10-CM | POA: Diagnosis not present

## 2022-08-30 DIAGNOSIS — R0989 Other specified symptoms and signs involving the circulatory and respiratory systems: Secondary | ICD-10-CM | POA: Diagnosis not present

## 2022-08-30 DIAGNOSIS — D638 Anemia in other chronic diseases classified elsewhere: Secondary | ICD-10-CM | POA: Diagnosis not present

## 2022-08-30 DIAGNOSIS — R0609 Other forms of dyspnea: Secondary | ICD-10-CM | POA: Diagnosis not present

## 2022-08-30 DIAGNOSIS — J028 Acute pharyngitis due to other specified organisms: Secondary | ICD-10-CM | POA: Diagnosis not present

## 2022-08-30 DIAGNOSIS — I10 Essential (primary) hypertension: Secondary | ICD-10-CM | POA: Diagnosis not present

## 2022-08-30 DIAGNOSIS — M2550 Pain in unspecified joint: Secondary | ICD-10-CM | POA: Diagnosis not present

## 2022-08-30 DIAGNOSIS — R0602 Shortness of breath: Secondary | ICD-10-CM | POA: Diagnosis not present

## 2022-08-30 DIAGNOSIS — Z03818 Encounter for observation for suspected exposure to other biological agents ruled out: Secondary | ICD-10-CM | POA: Diagnosis not present

## 2022-09-26 DIAGNOSIS — G8929 Other chronic pain: Secondary | ICD-10-CM | POA: Diagnosis not present

## 2022-09-26 DIAGNOSIS — M5442 Lumbago with sciatica, left side: Secondary | ICD-10-CM | POA: Diagnosis not present

## 2022-09-26 DIAGNOSIS — I10 Essential (primary) hypertension: Secondary | ICD-10-CM | POA: Diagnosis not present

## 2022-09-26 DIAGNOSIS — R7989 Other specified abnormal findings of blood chemistry: Secondary | ICD-10-CM | POA: Diagnosis not present

## 2022-09-26 DIAGNOSIS — R7309 Other abnormal glucose: Secondary | ICD-10-CM | POA: Diagnosis not present

## 2022-09-26 DIAGNOSIS — M5441 Lumbago with sciatica, right side: Secondary | ICD-10-CM | POA: Diagnosis not present

## 2022-09-26 DIAGNOSIS — D638 Anemia in other chronic diseases classified elsewhere: Secondary | ICD-10-CM | POA: Diagnosis not present

## 2022-10-02 DIAGNOSIS — E039 Hypothyroidism, unspecified: Secondary | ICD-10-CM | POA: Diagnosis not present

## 2022-10-02 DIAGNOSIS — M353 Polymyalgia rheumatica: Secondary | ICD-10-CM | POA: Diagnosis not present

## 2022-10-02 DIAGNOSIS — N951 Menopausal and female climacteric states: Secondary | ICD-10-CM | POA: Diagnosis not present

## 2022-10-02 DIAGNOSIS — M2550 Pain in unspecified joint: Secondary | ICD-10-CM | POA: Diagnosis not present

## 2022-10-02 DIAGNOSIS — Z0000 Encounter for general adult medical examination without abnormal findings: Secondary | ICD-10-CM | POA: Diagnosis not present

## 2022-10-02 DIAGNOSIS — D638 Anemia in other chronic diseases classified elsewhere: Secondary | ICD-10-CM | POA: Diagnosis not present

## 2022-10-02 DIAGNOSIS — I10 Essential (primary) hypertension: Secondary | ICD-10-CM | POA: Diagnosis not present

## 2022-10-02 DIAGNOSIS — Z1231 Encounter for screening mammogram for malignant neoplasm of breast: Secondary | ICD-10-CM | POA: Diagnosis not present

## 2022-10-02 DIAGNOSIS — E790 Hyperuricemia without signs of inflammatory arthritis and tophaceous disease: Secondary | ICD-10-CM | POA: Diagnosis not present

## 2022-10-03 ENCOUNTER — Other Ambulatory Visit: Payer: Self-pay | Admitting: Internal Medicine

## 2022-10-03 DIAGNOSIS — Z1231 Encounter for screening mammogram for malignant neoplasm of breast: Secondary | ICD-10-CM

## 2022-10-04 DIAGNOSIS — N951 Menopausal and female climacteric states: Secondary | ICD-10-CM | POA: Diagnosis not present

## 2022-10-04 DIAGNOSIS — E790 Hyperuricemia without signs of inflammatory arthritis and tophaceous disease: Secondary | ICD-10-CM | POA: Diagnosis not present

## 2022-11-07 ENCOUNTER — Ambulatory Visit
Admission: RE | Admit: 2022-11-07 | Discharge: 2022-11-07 | Disposition: A | Discharge status: Home / Self Care | Payer: 59 | Source: Ambulatory Visit | Attending: Internal Medicine | Admitting: Internal Medicine

## 2022-11-07 DIAGNOSIS — Z1231 Encounter for screening mammogram for malignant neoplasm of breast: Secondary | ICD-10-CM | POA: Insufficient documentation

## 2022-11-13 ENCOUNTER — Other Ambulatory Visit: Payer: Self-pay | Admitting: Internal Medicine

## 2022-11-13 DIAGNOSIS — R928 Other abnormal and inconclusive findings on diagnostic imaging of breast: Secondary | ICD-10-CM

## 2022-11-21 ENCOUNTER — Other Ambulatory Visit: Payer: 59

## 2022-11-24 ENCOUNTER — Ambulatory Visit
Admission: RE | Admit: 2022-11-24 | Discharge: 2022-11-24 | Disposition: A | Discharge status: Home / Self Care | Payer: 59 | Source: Ambulatory Visit | Attending: Internal Medicine | Admitting: Internal Medicine

## 2022-11-24 DIAGNOSIS — R928 Other abnormal and inconclusive findings on diagnostic imaging of breast: Secondary | ICD-10-CM | POA: Diagnosis not present

## 2022-11-24 DIAGNOSIS — R92322 Mammographic fibroglandular density, left breast: Secondary | ICD-10-CM | POA: Diagnosis not present

## 2022-11-24 DIAGNOSIS — N6320 Unspecified lump in the left breast, unspecified quadrant: Secondary | ICD-10-CM | POA: Diagnosis not present

## 2022-11-24 DIAGNOSIS — Z803 Family history of malignant neoplasm of breast: Secondary | ICD-10-CM | POA: Diagnosis not present

## 2022-11-28 ENCOUNTER — Other Ambulatory Visit: Payer: Self-pay | Admitting: Internal Medicine

## 2022-11-28 DIAGNOSIS — R928 Other abnormal and inconclusive findings on diagnostic imaging of breast: Secondary | ICD-10-CM

## 2022-12-04 DIAGNOSIS — E790 Hyperuricemia without signs of inflammatory arthritis and tophaceous disease: Secondary | ICD-10-CM | POA: Diagnosis not present

## 2022-12-04 DIAGNOSIS — M2550 Pain in unspecified joint: Secondary | ICD-10-CM | POA: Diagnosis not present

## 2022-12-04 DIAGNOSIS — I10 Essential (primary) hypertension: Secondary | ICD-10-CM | POA: Diagnosis not present

## 2022-12-04 DIAGNOSIS — E039 Hypothyroidism, unspecified: Secondary | ICD-10-CM | POA: Diagnosis not present

## 2022-12-04 DIAGNOSIS — N6320 Unspecified lump in the left breast, unspecified quadrant: Secondary | ICD-10-CM | POA: Diagnosis not present

## 2022-12-06 ENCOUNTER — Ambulatory Visit
Admission: RE | Admit: 2022-12-06 | Discharge: 2022-12-06 | Disposition: A | Discharge status: Home / Self Care | Payer: 59 | Source: Ambulatory Visit | Attending: Internal Medicine | Admitting: Internal Medicine

## 2022-12-06 DIAGNOSIS — R928 Other abnormal and inconclusive findings on diagnostic imaging of breast: Secondary | ICD-10-CM | POA: Diagnosis not present

## 2022-12-06 DIAGNOSIS — N6321 Unspecified lump in the left breast, upper outer quadrant: Secondary | ICD-10-CM | POA: Insufficient documentation

## 2022-12-06 DIAGNOSIS — R920 Mammographic microcalcification found on diagnostic imaging of breast: Secondary | ICD-10-CM | POA: Diagnosis not present

## 2022-12-06 HISTORY — PX: BREAST BIOPSY: SHX20

## 2022-12-06 MED ORDER — CHLOROPROCAINE HCL (PF) 3 % IJ SOLN
10.0000 mL | Freq: Once | INTRAMUSCULAR | Status: AC
  Administered 2022-12-06: 10 mL
  Filled 2022-12-06: qty 10

## 2022-12-07 ENCOUNTER — Encounter: Payer: Self-pay | Admitting: *Deleted

## 2022-12-07 LAB — SURGICAL PATHOLOGY

## 2022-12-07 NOTE — Progress Notes (Signed)
Attempted to call patient, no answer, VM is full

## 2022-12-08 ENCOUNTER — Encounter: Payer: Self-pay | Admitting: *Deleted

## 2022-12-08 NOTE — Progress Notes (Unsigned)
Ms. Ellenberger would like to be referred to [facility].  She will see

## 2022-12-12 DIAGNOSIS — Q859 Phakomatosis, unspecified: Secondary | ICD-10-CM | POA: Diagnosis not present

## 2022-12-25 DIAGNOSIS — E039 Hypothyroidism, unspecified: Secondary | ICD-10-CM | POA: Diagnosis not present

## 2022-12-25 DIAGNOSIS — I10 Essential (primary) hypertension: Secondary | ICD-10-CM | POA: Diagnosis not present

## 2022-12-25 DIAGNOSIS — N6320 Unspecified lump in the left breast, unspecified quadrant: Secondary | ICD-10-CM | POA: Diagnosis not present

## 2022-12-25 DIAGNOSIS — D638 Anemia in other chronic diseases classified elsewhere: Secondary | ICD-10-CM | POA: Diagnosis not present

## 2022-12-25 DIAGNOSIS — M353 Polymyalgia rheumatica: Secondary | ICD-10-CM | POA: Diagnosis not present

## 2022-12-25 DIAGNOSIS — E790 Hyperuricemia without signs of inflammatory arthritis and tophaceous disease: Secondary | ICD-10-CM | POA: Diagnosis not present

## 2022-12-25 DIAGNOSIS — R0789 Other chest pain: Secondary | ICD-10-CM | POA: Diagnosis not present

## 2022-12-27 ENCOUNTER — Other Ambulatory Visit: Payer: Self-pay | Admitting: Internal Medicine

## 2022-12-27 DIAGNOSIS — R0789 Other chest pain: Secondary | ICD-10-CM

## 2023-01-03 ENCOUNTER — Ambulatory Visit
Admission: RE | Admit: 2023-01-03 | Discharge: 2023-01-03 | Disposition: A | Discharge status: Home / Self Care | Payer: 59 | Source: Ambulatory Visit | Attending: Internal Medicine | Admitting: Internal Medicine

## 2023-01-03 DIAGNOSIS — R0789 Other chest pain: Secondary | ICD-10-CM | POA: Diagnosis not present

## 2023-01-03 DIAGNOSIS — R002 Palpitations: Secondary | ICD-10-CM | POA: Diagnosis not present

## 2023-01-03 DIAGNOSIS — R079 Chest pain, unspecified: Secondary | ICD-10-CM | POA: Diagnosis not present

## 2023-01-03 LAB — POCT I-STAT CREATININE: Creatinine, Ser: 1.1 mg/dL — ABNORMAL HIGH (ref 0.44–1.00)

## 2023-01-03 MED ORDER — IOHEXOL 350 MG/ML SOLN
75.0000 mL | Freq: Once | INTRAVENOUS | Status: AC | PRN
  Administered 2023-01-03: 75 mL via INTRAVENOUS

## 2023-03-09 DIAGNOSIS — H9201 Otalgia, right ear: Secondary | ICD-10-CM | POA: Diagnosis not present

## 2023-03-09 DIAGNOSIS — R0789 Other chest pain: Secondary | ICD-10-CM | POA: Diagnosis not present

## 2023-03-09 DIAGNOSIS — R110 Nausea: Secondary | ICD-10-CM | POA: Diagnosis not present

## 2023-04-06 DIAGNOSIS — E039 Hypothyroidism, unspecified: Secondary | ICD-10-CM | POA: Diagnosis not present

## 2023-04-06 DIAGNOSIS — M353 Polymyalgia rheumatica: Secondary | ICD-10-CM | POA: Diagnosis not present

## 2023-04-06 DIAGNOSIS — Z6841 Body Mass Index (BMI) 40.0 and over, adult: Secondary | ICD-10-CM | POA: Diagnosis not present

## 2023-04-06 DIAGNOSIS — I10 Essential (primary) hypertension: Secondary | ICD-10-CM | POA: Diagnosis not present

## 2023-04-06 DIAGNOSIS — G4733 Obstructive sleep apnea (adult) (pediatric): Secondary | ICD-10-CM | POA: Diagnosis not present

## 2023-04-06 DIAGNOSIS — E790 Hyperuricemia without signs of inflammatory arthritis and tophaceous disease: Secondary | ICD-10-CM | POA: Diagnosis not present

## 2023-04-06 DIAGNOSIS — F411 Generalized anxiety disorder: Secondary | ICD-10-CM | POA: Diagnosis not present

## 2023-05-04 DIAGNOSIS — M353 Polymyalgia rheumatica: Secondary | ICD-10-CM | POA: Diagnosis not present

## 2023-05-04 DIAGNOSIS — M5442 Lumbago with sciatica, left side: Secondary | ICD-10-CM | POA: Diagnosis not present

## 2023-05-04 DIAGNOSIS — F411 Generalized anxiety disorder: Secondary | ICD-10-CM | POA: Diagnosis not present

## 2023-05-04 DIAGNOSIS — G8929 Other chronic pain: Secondary | ICD-10-CM | POA: Diagnosis not present

## 2023-05-04 DIAGNOSIS — E039 Hypothyroidism, unspecified: Secondary | ICD-10-CM | POA: Diagnosis not present

## 2023-05-04 DIAGNOSIS — E790 Hyperuricemia without signs of inflammatory arthritis and tophaceous disease: Secondary | ICD-10-CM | POA: Diagnosis not present

## 2023-05-04 DIAGNOSIS — D649 Anemia, unspecified: Secondary | ICD-10-CM | POA: Diagnosis not present

## 2023-05-04 DIAGNOSIS — I10 Essential (primary) hypertension: Secondary | ICD-10-CM | POA: Diagnosis not present

## 2023-05-04 DIAGNOSIS — E119 Type 2 diabetes mellitus without complications: Secondary | ICD-10-CM | POA: Diagnosis not present

## 2023-05-04 DIAGNOSIS — M5441 Lumbago with sciatica, right side: Secondary | ICD-10-CM | POA: Diagnosis not present

## 2023-05-14 ENCOUNTER — Other Ambulatory Visit: Payer: Self-pay | Admitting: Internal Medicine

## 2023-05-14 DIAGNOSIS — N649 Disorder of breast, unspecified: Secondary | ICD-10-CM

## 2023-06-06 ENCOUNTER — Ambulatory Visit
Admission: RE | Admit: 2023-06-06 | Discharge: 2023-06-06 | Disposition: A | Discharge status: Home / Self Care | Source: Ambulatory Visit | Attending: Internal Medicine | Admitting: Internal Medicine

## 2023-06-06 DIAGNOSIS — R92333 Mammographic heterogeneous density, bilateral breasts: Secondary | ICD-10-CM | POA: Diagnosis not present

## 2023-06-06 DIAGNOSIS — N649 Disorder of breast, unspecified: Secondary | ICD-10-CM | POA: Diagnosis not present

## 2023-06-06 DIAGNOSIS — R928 Other abnormal and inconclusive findings on diagnostic imaging of breast: Secondary | ICD-10-CM | POA: Diagnosis not present

## 2023-06-06 DIAGNOSIS — Z803 Family history of malignant neoplasm of breast: Secondary | ICD-10-CM | POA: Diagnosis not present

## 2023-07-22 DIAGNOSIS — H61321 Acquired stenosis of right external ear canal secondary to inflammation and infection: Secondary | ICD-10-CM | POA: Diagnosis not present

## 2023-08-06 DIAGNOSIS — L918 Other hypertrophic disorders of the skin: Secondary | ICD-10-CM | POA: Diagnosis not present

## 2023-08-06 DIAGNOSIS — L989 Disorder of the skin and subcutaneous tissue, unspecified: Secondary | ICD-10-CM | POA: Diagnosis not present

## 2023-08-24 DIAGNOSIS — E119 Type 2 diabetes mellitus without complications: Secondary | ICD-10-CM | POA: Diagnosis not present

## 2023-08-24 DIAGNOSIS — F411 Generalized anxiety disorder: Secondary | ICD-10-CM | POA: Diagnosis not present

## 2023-08-24 DIAGNOSIS — E790 Hyperuricemia without signs of inflammatory arthritis and tophaceous disease: Secondary | ICD-10-CM | POA: Diagnosis not present

## 2023-08-24 DIAGNOSIS — D649 Anemia, unspecified: Secondary | ICD-10-CM | POA: Diagnosis not present

## 2023-09-04 DIAGNOSIS — B369 Superficial mycosis, unspecified: Secondary | ICD-10-CM | POA: Diagnosis not present

## 2023-09-04 DIAGNOSIS — N6320 Unspecified lump in the left breast, unspecified quadrant: Secondary | ICD-10-CM | POA: Diagnosis not present

## 2023-09-04 DIAGNOSIS — H6241 Otitis externa in other diseases classified elsewhere, right ear: Secondary | ICD-10-CM | POA: Diagnosis not present

## 2023-09-05 DIAGNOSIS — Q859 Phakomatosis, unspecified: Secondary | ICD-10-CM | POA: Diagnosis not present

## 2023-10-01 DIAGNOSIS — H60331 Swimmer's ear, right ear: Secondary | ICD-10-CM | POA: Diagnosis not present

## 2023-10-01 DIAGNOSIS — H6121 Impacted cerumen, right ear: Secondary | ICD-10-CM | POA: Diagnosis not present

## 2023-10-01 DIAGNOSIS — R42 Dizziness and giddiness: Secondary | ICD-10-CM | POA: Diagnosis not present

## 2023-10-22 DIAGNOSIS — H9201 Otalgia, right ear: Secondary | ICD-10-CM | POA: Diagnosis not present

## 2023-10-22 DIAGNOSIS — H60331 Swimmer's ear, right ear: Secondary | ICD-10-CM | POA: Diagnosis not present

## 2023-11-02 ENCOUNTER — Encounter: Payer: Self-pay | Admitting: *Deleted

## 2023-11-02 NOTE — Progress Notes (Signed)
 Carly Norton                                          MRN: [id number]   11/02/2023   The [facility] Quality Team Specialist reviewed this patient medical record for the purposes of chart review for care gap closure. The following were reviewed: abstraction for care gap closure-controlling blood pressure and glycemic status assessment.    [facility] Quality Team

## 2023-11-20 ENCOUNTER — Encounter: Payer: Self-pay | Admitting: Internal Medicine

## 2023-11-20 DIAGNOSIS — N6320 Unspecified lump in the left breast, unspecified quadrant: Secondary | ICD-10-CM | POA: Diagnosis not present

## 2023-11-20 DIAGNOSIS — I10 Essential (primary) hypertension: Secondary | ICD-10-CM | POA: Diagnosis not present

## 2023-11-20 DIAGNOSIS — H61321 Acquired stenosis of right external ear canal secondary to inflammation and infection: Secondary | ICD-10-CM | POA: Diagnosis not present

## 2023-11-20 DIAGNOSIS — F411 Generalized anxiety disorder: Secondary | ICD-10-CM | POA: Diagnosis not present

## 2023-11-20 DIAGNOSIS — Z0000 Encounter for general adult medical examination without abnormal findings: Secondary | ICD-10-CM | POA: Diagnosis not present

## 2023-11-20 DIAGNOSIS — E039 Hypothyroidism, unspecified: Secondary | ICD-10-CM | POA: Diagnosis not present

## 2023-11-20 DIAGNOSIS — Z1331 Encounter for screening for depression: Secondary | ICD-10-CM | POA: Diagnosis not present

## 2023-11-27 ENCOUNTER — Other Ambulatory Visit: Payer: Self-pay | Admitting: Internal Medicine

## 2023-11-27 DIAGNOSIS — R928 Other abnormal and inconclusive findings on diagnostic imaging of breast: Secondary | ICD-10-CM

## 2023-11-27 DIAGNOSIS — N6489 Other specified disorders of breast: Secondary | ICD-10-CM

## 2023-11-28 DIAGNOSIS — H60331 Swimmer's ear, right ear: Secondary | ICD-10-CM | POA: Diagnosis not present

## 2023-12-10 ENCOUNTER — Ambulatory Visit
Admission: RE | Admit: 2023-12-10 | Discharge: 2023-12-10 | Disposition: A | Discharge status: Home / Self Care | Source: Ambulatory Visit | Attending: Internal Medicine | Admitting: Internal Medicine

## 2023-12-10 ENCOUNTER — Ambulatory Visit
Admission: RE | Admit: 2023-12-10 | Discharge: 2023-12-10 | Disposition: A | Source: Ambulatory Visit | Attending: Internal Medicine | Admitting: Internal Medicine

## 2023-12-10 DIAGNOSIS — R928 Other abnormal and inconclusive findings on diagnostic imaging of breast: Secondary | ICD-10-CM | POA: Diagnosis not present

## 2023-12-10 DIAGNOSIS — N6489 Other specified disorders of breast: Secondary | ICD-10-CM

## 2023-12-10 DIAGNOSIS — R92333 Mammographic heterogeneous density, bilateral breasts: Secondary | ICD-10-CM | POA: Diagnosis not present

## 2023-12-10 DIAGNOSIS — H6063 Unspecified chronic otitis externa, bilateral: Secondary | ICD-10-CM | POA: Diagnosis not present

## 2023-12-11 ENCOUNTER — Ambulatory Visit: Admitting: Physician Assistant

## 2023-12-11 ENCOUNTER — Encounter: Payer: Self-pay | Admitting: Physician Assistant

## 2023-12-11 VITALS — BP 102/56 | HR 68 | Temp 98.1°F | Resp 14 | Ht 65.0 in | Wt 217.0 lb

## 2023-12-11 DIAGNOSIS — J029 Acute pharyngitis, unspecified: Secondary | ICD-10-CM

## 2023-12-11 LAB — POCT RAPID STREP A (OFFICE): Rapid Strep A Screen: POSITIVE — AB

## 2023-12-11 MED ORDER — AMOXICILLIN 875 MG PO TABS: 875.0000 mg | ORAL_TABLET | Freq: Two times a day (BID) | ORAL | 0 refills | Status: AC

## 2023-12-11 MED ORDER — METHYLPREDNISOLONE 4 MG PO TBPK: ORAL_TABLET | ORAL | 0 refills | Status: AC

## 2023-12-11 MED ORDER — PSEUDOEPH-BROMPHEN-DM 30-2-10 MG/5ML PO SYRP: 5.0000 mL | ORAL_SOLUTION | Freq: Four times a day (QID) | ORAL | 0 refills | Status: AC | PRN

## 2023-12-11 NOTE — Progress Notes (Signed)
   Subjective: Sore throat    Patient ID: Carly Norton, female    DOB: 08-13-1970, 53 y.o.   MRN: [id number]  HPI Patient complaining of 2 days of nasal congestion, fatigue, sore throat, and bodyaches.  Patient also has intermittent productive/nonproductive cough.  Denies nausea, vomiting, diarrhea.  No palliative measures for complaint.   Review of Systems Hypertension, hypothyroidism, and migraine headaches.    Objective:   Physical Exam BP 102/56  BP Location Left Arm  Patient Position Sitting  Cuff Size Large  Pulse Rate 68  Temp 98.1 F (36.7 C)  Temp Source Oral  Weight 217 lb (98.4 kg)  Height 5' 5 (1.651 m)  Resp 14  SpO2 99 %   BMI: 36.11 kg/m2  BSA: 2.12 m2  HEENT is remarkable for postnasal drainage.  Edematous tonsils without exudate. Neck is supple with bilateral lymphadenopathy. Lungs are clear to auscultation. Heart regular rate and rhythm. Positive rapid strep test.     Assessment & Plan:  Patient given prescription amoxicillin, Bromfed-DM, Medrol  Dosepak.  Patient given a work note and may return back to work on 12/13/2023.  Follow-up in clinic if condition worsens or no improvement.

## 2023-12-11 NOTE — Progress Notes (Signed)
 S/Sx x2 days: Sore throat Body Aches Left nostril feels sore Chills - hasn't checked her temperature Cough - productive at times - clear mucus SOB at rest & like walking from parking lot to building  Denies headache, facial pain/pressure, ear discomfort  Went to ENT yesterday for follow-up for right ear & it was OK - previously on antibiotic ear drops  No OTC medications have been tried

## 2023-12-26 ENCOUNTER — Encounter: Payer: Self-pay | Admitting: *Deleted

## 2023-12-26 NOTE — Progress Notes (Signed)
 Carly Norton                                          MRN: [id number]   12/26/2023   The [facility] Quality Team Specialist reviewed this patient medical record for the purposes of chart review for care gap closure. The following were reviewed: abstraction for care gap closure-glycemic status assessment.    [facility] Quality Team

## 2024-01-22 DIAGNOSIS — E039 Hypothyroidism, unspecified: Secondary | ICD-10-CM | POA: Diagnosis not present

## 2024-01-22 DIAGNOSIS — M47816 Spondylosis without myelopathy or radiculopathy, lumbar region: Secondary | ICD-10-CM | POA: Diagnosis not present

## 2024-01-22 DIAGNOSIS — R002 Palpitations: Secondary | ICD-10-CM | POA: Diagnosis not present

## 2024-01-22 DIAGNOSIS — R5383 Other fatigue: Secondary | ICD-10-CM | POA: Diagnosis not present

## 2024-01-22 DIAGNOSIS — I10 Essential (primary) hypertension: Secondary | ICD-10-CM | POA: Diagnosis not present

## 2024-01-22 DIAGNOSIS — R202 Paresthesia of skin: Secondary | ICD-10-CM | POA: Diagnosis not present

## 2024-01-22 DIAGNOSIS — G4733 Obstructive sleep apnea (adult) (pediatric): Secondary | ICD-10-CM | POA: Diagnosis not present

## 2024-03-13 ENCOUNTER — Ambulatory Visit
Admission: RE | Admit: 2024-03-13 | Discharge: 2024-03-13 | Disposition: A | Source: Ambulatory Visit | Attending: Internal Medicine | Admitting: Internal Medicine

## 2024-03-13 ENCOUNTER — Other Ambulatory Visit: Payer: Self-pay | Admitting: Internal Medicine

## 2024-03-13 DIAGNOSIS — R600 Localized edema: Secondary | ICD-10-CM
